# Patient Record
Sex: Female | Born: 1960 | Race: Black or African American | Hispanic: No | Smoking: Never smoker
Health system: Southern US, Community
[De-identification: ages and names within clinical notes are randomized; demographics above are authoritative.]

## PROBLEM LIST (undated history)

## (undated) DIAGNOSIS — E7800 Pure hypercholesterolemia, unspecified: Secondary | ICD-10-CM

## (undated) DIAGNOSIS — B019 Varicella without complication: Secondary | ICD-10-CM

## (undated) DIAGNOSIS — J309 Allergic rhinitis, unspecified: Secondary | ICD-10-CM

## (undated) HISTORY — DX: Allergic rhinitis, unspecified: J30.9

## (undated) HISTORY — PX: TUBAL LIGATION: SHX77

## (undated) HISTORY — DX: Varicella without complication: B01.9

## (undated) HISTORY — DX: Pure hypercholesterolemia, unspecified: E78.00

---

## 2009-01-23 ENCOUNTER — Other Ambulatory Visit: Admission: RE | Admit: 2009-01-23 | Discharge: 2009-01-23 | Payer: Self-pay | Admitting: Family Medicine

## 2009-01-23 ENCOUNTER — Ambulatory Visit: Payer: Self-pay | Admitting: Family Medicine

## 2009-02-14 ENCOUNTER — Encounter: Admission: RE | Admit: 2009-02-14 | Discharge: 2009-02-14 | Payer: Self-pay | Admitting: Family Medicine

## 2010-09-21 ENCOUNTER — Ambulatory Visit: Payer: Self-pay | Admitting: Family Medicine

## 2010-09-22 ENCOUNTER — Encounter: Admission: RE | Admit: 2010-09-22 | Discharge: 2010-09-22 | Payer: Self-pay | Admitting: Family Medicine

## 2010-09-29 ENCOUNTER — Encounter: Admission: RE | Admit: 2010-09-29 | Discharge: 2010-09-29 | Payer: Self-pay | Admitting: Family Medicine

## 2010-10-12 LAB — HM PAP SMEAR: HM Pap smear: NORMAL

## 2010-10-12 LAB — HM MAMMOGRAPHY: HM Mammogram: NEGATIVE

## 2010-12-18 ENCOUNTER — Other Ambulatory Visit: Payer: Self-pay | Admitting: Family Medicine

## 2010-12-18 ENCOUNTER — Other Ambulatory Visit: Payer: Self-pay | Admitting: Physician Assistant

## 2010-12-18 DIAGNOSIS — Z1231 Encounter for screening mammogram for malignant neoplasm of breast: Secondary | ICD-10-CM

## 2011-01-06 ENCOUNTER — Ambulatory Visit
Admission: RE | Admit: 2011-01-06 | Discharge: 2011-01-06 | Disposition: A | Discharge status: Home / Self Care | Payer: BC Managed Care – PPO | Source: Ambulatory Visit | Attending: Family Medicine | Admitting: Family Medicine

## 2011-01-06 DIAGNOSIS — Z1231 Encounter for screening mammogram for malignant neoplasm of breast: Secondary | ICD-10-CM

## 2011-01-11 ENCOUNTER — Encounter (INDEPENDENT_AMBULATORY_CARE_PROVIDER_SITE_OTHER): Payer: BC Managed Care – PPO | Admitting: Family Medicine

## 2011-01-11 DIAGNOSIS — Z0000 Encounter for general adult medical examination without abnormal findings: Secondary | ICD-10-CM

## 2011-01-11 DIAGNOSIS — E7800 Pure hypercholesterolemia, unspecified: Secondary | ICD-10-CM

## 2011-04-27 ENCOUNTER — Encounter: Payer: Self-pay | Admitting: Family Medicine

## 2011-05-17 ENCOUNTER — Encounter: Payer: Self-pay | Admitting: Family Medicine

## 2011-05-19 ENCOUNTER — Ambulatory Visit: Payer: BC Managed Care – PPO | Admitting: Family Medicine

## 2011-05-19 ENCOUNTER — Ambulatory Visit (INDEPENDENT_AMBULATORY_CARE_PROVIDER_SITE_OTHER): Payer: BC Managed Care – PPO | Admitting: Family Medicine

## 2011-05-19 DIAGNOSIS — E7800 Pure hypercholesterolemia, unspecified: Secondary | ICD-10-CM

## 2011-05-20 ENCOUNTER — Telehealth: Payer: Self-pay | Admitting: *Deleted

## 2011-05-20 LAB — LIPID PANEL
LDL Cholesterol: 151 mg/dL — ABNORMAL HIGH (ref 0–99)
Total CHOL/HDL Ratio: 4.3 Ratio
VLDL: 15 mg/dL (ref 0–40)

## 2011-05-20 NOTE — Telephone Encounter (Signed)
Spoke with patient, gave her labs results. She will schedule appt for lipid panel in 6 months and an OV a few days after, she could not commit to an appt today but assured me that she will call back in Sept to do so. She will bring her BP machine and some readings to that OV in January, she will come in sooner if BP's should rise above 140/90.

## 2012-01-03 ENCOUNTER — Other Ambulatory Visit: Payer: Self-pay | Admitting: Family Medicine

## 2012-01-03 DIAGNOSIS — Z1231 Encounter for screening mammogram for malignant neoplasm of breast: Secondary | ICD-10-CM

## 2012-01-05 ENCOUNTER — Other Ambulatory Visit: Payer: Self-pay | Admitting: *Deleted

## 2012-01-05 DIAGNOSIS — E7800 Pure hypercholesterolemia, unspecified: Secondary | ICD-10-CM

## 2012-01-10 ENCOUNTER — Other Ambulatory Visit: Payer: BC Managed Care – PPO

## 2012-01-10 DIAGNOSIS — E7800 Pure hypercholesterolemia, unspecified: Secondary | ICD-10-CM

## 2012-01-10 LAB — LIPID PANEL
HDL: 57 mg/dL (ref >39)
Triglycerides: 84 mg/dL (ref <150)

## 2012-01-12 ENCOUNTER — Encounter: Payer: Self-pay | Admitting: Family Medicine

## 2012-01-12 ENCOUNTER — Ambulatory Visit (INDEPENDENT_AMBULATORY_CARE_PROVIDER_SITE_OTHER): Payer: BC Managed Care – PPO | Admitting: Family Medicine

## 2012-01-12 DIAGNOSIS — IMO0001 Reserved for inherently not codable concepts without codable children: Secondary | ICD-10-CM | POA: Insufficient documentation

## 2012-01-12 DIAGNOSIS — R030 Elevated blood-pressure reading, without diagnosis of hypertension: Secondary | ICD-10-CM

## 2012-01-12 DIAGNOSIS — E7800 Pure hypercholesterolemia, unspecified: Secondary | ICD-10-CM

## 2012-01-12 NOTE — Patient Instructions (Signed)
Continue to periodically monitor BP.  Low sodium diet.  Try and get at least 30 minutes of exercise every day (5-6 days/week).  Continue low cholesterol diet.  2 Gram Low Sodium Diet A 2 gram sodium diet restricts the amount of sodium in the diet to no more than 2 g or 2000 mg daily. Limiting the amount of sodium is often used to help lower blood pressure. It is important if you have heart, liver, or kidney problems. Many foods contain sodium for flavor and sometimes as a preservative. When the amount of sodium in a diet needs to be low, it is important to know what to look for when choosing foods and drinks. The following includes some information and guidelines to help make it easier for you to adapt to a low sodium diet. QUICK TIPS  Do not add salt to food.   Avoid convenience items and fast food.   Choose unsalted snack foods.   Buy lower sodium products, often labeled as "lower sodium" or "no salt added."   Check food labels to learn how much sodium is in 1 serving.   When eating at a restaurant, ask that your food be prepared with less salt or none, if possible.  READING FOOD LABELS FOR SODIUM INFORMATION The nutrition facts label is a good place to find how much sodium is in foods. Look for products with no more than 500 to 600 mg of sodium per meal and no more than 150 mg per serving. Remember that 2 g = 2000 mg. The food label may also list foods as:  Sodium-free: Less than 5 mg in a serving.   Very low sodium: 35 mg or less in a serving.   Low-sodium: 140 mg or less in a serving.   Light in sodium: 50% less sodium in a serving. For example, if a food that usually has 300 mg of sodium is changed to become light in sodium, it will have 150 mg of sodium.   Reduced sodium: 25% less sodium in a serving. For example, if a food that usually has 400 mg of sodium is changed to reduced sodium, it will have 300 mg of sodium.  CHOOSING FOODS Grains  Avoid: Salted crackers and snack  items. Some cereals, including instant hot cereals. Bread stuffing and biscuit mixes. Seasoned rice or pasta mixes.   Choose: Unsalted snack items. Low-sodium cereals, oats, puffed wheat and rice, shredded wheat. English muffins and bread. Pasta.  Meats  Avoid: Salted, canned, smoked, spiced, pickled meats, including fish and poultry. Bacon, ham, sausage, cold cuts, hot dogs, anchovies.   Choose: Low-sodium canned tuna and salmon. Fresh or frozen meat, poultry, and fish.  Dairy  Avoid: Processed cheese and spreads. Cottage cheese. Buttermilk and condensed milk. Regular cheese.   Choose: Milk. Low-sodium cottage cheese. Yogurt. Sour cream. Low-sodium cheese.  Fruits and Vegetables  Avoid: Regular canned vegetables. Regular canned tomato sauce and paste. Frozen vegetables in sauces. Olives. Rosita Fire. Relishes. Sauerkraut.   Choose: Low-sodium canned vegetables. Low-sodium tomato sauce and paste. Frozen or fresh vegetables. Fresh and frozen fruit.  Condiments  Avoid: Canned and packaged gravies. Worcestershire sauce. Tartar sauce. Barbecue sauce. Soy sauce. Steak sauce. Ketchup. Onion, garlic, and table salt. Meat flavorings and tenderizers.   Choose: Fresh and dried herbs and spices. Low-sodium varieties of mustard and ketchup. Lemon juice. Tabasco sauce. Horseradish.  SAMPLE 2 GRAM SODIUM MEAL PLAN Breakfast / Sodium (mg)  1 cup low-fat milk / 143 mg   2 slices  whole-wheat toast / 270 mg   1 tbs heart-healthy margarine / 153 mg   1 hard-boiled egg / 139 mg   1 small orange / 0 mg  Lunch / Sodium (mg)  1 cup raw carrots / 76 mg    cup hummus / 298 mg   1 cup low-fat milk / 143 mg    cup red grapes / 2 mg   1 whole-wheat pita bread / 356 mg  Dinner / Sodium (mg)  1 cup whole-wheat pasta / 2 mg   1 cup low-sodium tomato sauce / 73 mg   3 oz lean ground beef / 57 mg   1 small side salad (1 cup raw spinach leaves,  cup cucumber,  cup yellow bell pepper) with 1 tsp  olive oil and 1 tsp red wine vinegar / 25 mg  Snack / Sodium (mg)  1 container low-fat vanilla yogurt / 107 mg   3 graham cracker squares / 127 mg  Nutrient Analysis  Calories: 2033   Protein: 77 g   Carbohydrate: 282 g   Fat: 72 g   Sodium: 1971 mg  Document Released: 10/25/2005 Document Revised: 10/14/2011 Document Reviewed: 01/26/2010 [facility] Patient Information 2012 Esp[place], Mildred.

## 2012-01-12 NOTE — Progress Notes (Signed)
Patient presents for f/u lipids. She was last seen a year ago for a physical, at which time she was having some elevated blood pressures, and was found to have high cholesterol.  Cholesterol was a little improved on repeat testing, and just had labs drawn for 6 month f/u.  She presents to f/u on those lipids. She has been eating healthier, eating more fruits, salads and greens.  Infrequent red meat, uses vinaigrette dressings.  Exercising 2-3 times/week on treadmill, and weights twice a week  BP's 128/80 while fast through [employer].  Was a little higher a few days ago (146/84), admits to having some canned foods.  Past Medical History  Diagnosis Date  . Allergic rhinitis, cause unspecified   . Pure hypercholesterolemia     Past Surgical History  Procedure Date  . Tubal ligation     History   Social History  . Marital Status: Widowed    Spouse Name: N/A    Number of Children: 3  . Years of Education: N/A   Occupational History  . Not on file.   Social History Main Topics  . Smoking status: Never Smoker   . Smokeless tobacco: Never Used  . Alcohol Use: No  . Drug Use: No  . Sexually Active:    Other Topics Concern  . Not on file   Social History Narrative   Lives with 2 sons, oldest son also in [place].  Widowed 07/2011.  From [facility]    Family History  Problem Relation Age of Onset  . Diabetes Mother   . Hyperlipidemia Mother   . Cancer Father     lymphoma    Current outpatient prescriptions:loratadine (CLARITIN) 10 MG tablet, Take 10 mg by mouth daily., Disp: , Rfl:   No Known Allergies  ROS:  Denies fevers.  Has some allergies, occasional chest congestion.  Denies exertional chest pain, palpitations, headaches, dizziness, edema, joint pains, skin rashes, depression (coping well after loss of her husband in September)  PHYSICAL EXAM: BP 102/68  Pulse 72  Ht 5\' 5"  (1.651 m)  Wt 144 lb (65.318 kg)  BMI 23.96 kg/m2  LMP 01/09/2012 134/82 Well developed, pleasant  female in no distress HEENT:  PERRL, EOMI, sclera anicteric, OP clear Neck: no lymphadenopathy, thyromegaly or mass Heart: regular rate and rhythm without murmur Lungs: clear bilaterally Abdomen: soft, nontender Extremities: no edema, 2+ pulse Skin: no rash Psych: normal mood, affect  Lab Results  Component Value Date   CHOL 222* 01/10/2012   CHOL 216* 05/19/2011   Lab Results  Component Value Date   HDL 57 01/10/2012   HDL 50 05/19/2011   Lab Results  Component Value Date   LDLCALC 148* 01/10/2012   LDLCALC 151* 05/19/2011   Lab Results  Component Value Date   TRIG 84 01/10/2012   TRIG 75 05/19/2011   Lab Results  Component Value Date   CHOLHDL 3.9 01/10/2012   CHOLHDL 4.3 05/19/2011   No results found for this basename: LDLDIRECT   ASSESSMENT/PLAN: 1. Pure hypercholesterolemia   2. Elevated BP    As long as her BP's remain normal, then her lipids are okay.  Ideally, LDL<130 is goal  But if not hypertensive, then <160 with normal ratio is okay.  Encouraged daily exercise, low sodium diet, and reviewed low cholesterol diet.  F/u at CPE/pap next available (she prefers to get GYN exams done as part of physical here rather than with GYN)  25 minute visit, more than 1/2 spent counseling

## 2012-01-28 ENCOUNTER — Ambulatory Visit
Admission: RE | Admit: 2012-01-28 | Discharge: 2012-01-28 | Disposition: A | Discharge status: Home / Self Care | Payer: BC Managed Care – PPO | Source: Ambulatory Visit | Attending: Family Medicine | Admitting: Family Medicine

## 2012-01-28 DIAGNOSIS — Z1231 Encounter for screening mammogram for malignant neoplasm of breast: Secondary | ICD-10-CM

## 2012-01-31 LAB — HM MAMMOGRAPHY: HM Mammogram: NEGATIVE

## 2012-03-20 ENCOUNTER — Encounter: Payer: Self-pay | Admitting: Family Medicine

## 2012-03-20 ENCOUNTER — Encounter: Payer: Self-pay | Admitting: Internal Medicine

## 2012-03-20 ENCOUNTER — Ambulatory Visit (INDEPENDENT_AMBULATORY_CARE_PROVIDER_SITE_OTHER): Payer: BC Managed Care – PPO | Admitting: Family Medicine

## 2012-03-20 VITALS — BP 126/82 | HR 64 | Ht 65.0 in | Wt 145.0 lb

## 2012-03-20 DIAGNOSIS — R5381 Other malaise: Secondary | ICD-10-CM

## 2012-03-20 DIAGNOSIS — Z1211 Encounter for screening for malignant neoplasm of colon: Secondary | ICD-10-CM

## 2012-03-20 DIAGNOSIS — Z0000 Encounter for general adult medical examination without abnormal findings: Secondary | ICD-10-CM

## 2012-03-20 LAB — CBC WITH DIFFERENTIAL/PLATELET
Basophils Absolute: 0 10*3/uL (ref 0.0–0.1)
Basophils Relative: 0 % (ref 0–1)
HCT: 37.1 % (ref 36.0–46.0)
Lymphocytes Relative: 26 % (ref 12–46)
MCHC: 31 g/dL (ref 30.0–36.0)
Monocytes Absolute: 0.3 10*3/uL (ref 0.1–1.0)
Neutro Abs: 4.4 10*3/uL (ref 1.7–7.7)
Neutrophils Relative %: 67 % (ref 43–77)
Platelets: 365 10*3/uL (ref 150–400)
RDW: 15.3 % (ref 11.5–15.5)
WBC: 6.6 10*3/uL (ref 4.0–10.5)

## 2012-03-20 LAB — COMPREHENSIVE METABOLIC PANEL
ALT: 11 U/L (ref 0–35)
AST: 17 U/L (ref 0–37)
Albumin: 4.7 g/dL (ref 3.5–5.2)
CO2: 26 mEq/L (ref 19–32)
Calcium: 9.3 mg/dL (ref 8.4–10.5)
Chloride: 105 mEq/L (ref 96–112)
Creat: 0.64 mg/dL (ref 0.50–1.10)
Potassium: 4.5 mEq/L (ref 3.5–5.3)

## 2012-03-20 LAB — POCT URINALYSIS DIPSTICK
Bilirubin, UA: NEGATIVE
Blood, UA: NEGATIVE
Glucose, UA: NEGATIVE
Ketones, UA: NEGATIVE
Leukocytes, UA: NEGATIVE
pH, UA: 8

## 2012-03-20 LAB — TSH: TSH: 1.133 u[IU]/mL (ref 0.350–4.500)

## 2012-03-20 NOTE — Patient Instructions (Addendum)
HEALTH MAINTENANCE RECOMMENDATIONS:  It is recommended that you get at least 30 minutes of aerobic exercise at least 5 days/week (for weight loss, you may need as much as 60-90 minutes). This can be any activity that gets your heart rate up. This can be divided in 10-15 minute intervals if needed, but try and build up your endurance at least once a week.  Weight bearing exercise is also recommended twice weekly.  Eat a healthy diet with lots of vegetables, fruits and fiber.  "Colorful" foods have a lot of vitamins (ie green vegetables, tomatoes, red peppers, etc).  Limit sweet tea, regular sodas and alcoholic beverages, all of which has a lot of calories and sugar.  Up to 1 alcoholic drink daily may be beneficial for women (unless trying to lose weight, watch sugars).  Drink a lot of water.  Calcium recommendations are 1200-1500 mg daily (1500 mg for postmenopausal women or women without ovaries), and vitamin D 1000 IU daily.  This should be obtained from diet and/or supplements (vitamins), and calcium should not be taken all at once, but in divided doses.  Monthly self breast exams and yearly mammograms for women over the age of 47 is recommended.  Sunscreen of at least SPF 30 should be used on all sun-exposed parts of the skin when outside between the hours of 10 am and 4 pm (not just when at beach or pool, but even with exercise, golf, tennis, and yard work!)  Use a sunscreen that says "broad spectrum" so it covers both UVA and UVB rays, and make sure to reapply every 1-2 hours.  Remember to change the batteries in your smoke detectors when changing your clock times in the spring and fall.  Use your seat belt every time you are in a car, and please drive safely and not be distracted with cell phones and texting while driving.  Consider shingles vaccine--check with your insurance to see if they cover it. It may need to wait until you are 60, if that is when they cover it.

## 2012-03-20 NOTE — Progress Notes (Signed)
Stephanie Wu is a 51 y.o. female who presents for a complete physical.  She has the following concerns: None  Periodically checks BP's, running 129/82 range. She was recently seen (<2 months ago) review her BP's and lipid results.  She continues to follow low cholesterol diet.  Health maintenance: Immunization History  Administered Date(s) Administered  . Tdap 04/27/2007   Last Pap smear: 10/2010 at GYN , normal per patient.  Denies any abnormal paps.  Last mammogram: 01/31/12 Last colonoscopy: never Last DEXA: never Dentist: twice yearly Ophtho: 2 years ago Exercise: 2-3 times/week on treadmill, weights twice a week  Past Medical History  Diagnosis Date  . Allergic rhinitis, cause unspecified   . Pure hypercholesterolemia   . Chicken pox age 48    Past Surgical History  Procedure Date  . Tubal ligation     History   Social History  . Marital Status: Widowed    Spouse Name: N/A    Number of Children: 3  . Years of Education: N/A   Occupational History  . Not on file.   Social History Main Topics  . Smoking status: Never Smoker   . Smokeless tobacco: Never Used  . Alcohol Use: No  . Drug Use: No  . Sexually Active:    Other Topics Concern  . Not on file   Social History Narrative   Lives with 2 sons, oldest son also in [place].  Widowed 07/2011.  From [facility]    Family History  Problem Relation Age of Onset  . Diabetes Mother   . Hyperlipidemia Mother   . Cancer Father     lymphoma    Current outpatient prescriptions:loratadine (CLARITIN) 10 MG tablet, Take 10 mg by mouth daily., Disp: , Rfl:   No Known Allergies  ROS:  The patient denies anorexia, fever, weight changes, headaches,  vision changes, decreased hearing, ear pain, sore throat, breast concerns, chest pain, palpitations, dizziness, syncope, dyspnea on exertion, cough, swelling, nausea, vomiting, diarrhea, constipation, abdominal pain, melena, hematochezia, indigestion/heartburn, hematuria,  incontinence, dysuria, irregular menstrual cycles, vaginal discharge, odor or itch, genital lesions, joint pains, numbness, tingling, weakness, tremor, suspicious skin lesions, depression, anxiety, abnormal bleeding/bruising, or enlarged lymph nodes.  Occasionally feels tired, as she is on the go a lot.  PHYSICAL EXAM: BP 126/82  Pulse 64  Ht 5\' 5"  (1.651 m)  Wt 145 lb (65.772 kg)  BMI 24.13 kg/m2  LMP 02/12/2012  General Appearance:    Alert, cooperative, no distress, appears stated age  Head:    Normocephalic, without obvious abnormality, atraumatic  Eyes:    PERRL, conjunctiva/corneas clear, EOM's intact, fundi    benign  Ears:    Normal TM's and external ear canals  Nose:   Nares normal, mucosa normal, no drainage or sinus   tenderness  Throat:   Lips, mucosa, and tongue normal; teeth and gums normal. Cobblestoning posteriorly  Neck:   Supple, no lymphadenopathy;  thyroid:  no   enlargement/tenderness/nodules; no carotid   bruit or JVD  Back:    Spine nontender, no curvature, ROM normal, no CVA     tenderness  Lungs:     Clear to auscultation bilaterally without wheezes, rales or     ronchi; respirations unlabored  Chest Wall:    No tenderness or deformity   Heart:    Regular rate and rhythm, S1 and S2 normal, no murmur, rub   or gallop  Breast Exam:    No tenderness, masses, or nipple discharge or inversion.  No axillary lymphadenopathy  Abdomen:     Soft, non-tender, nondistended, normoactive bowel sounds,    no masses, no hepatosplenomegaly  Genitalia:    Normal external genitalia without lesions.  BUS and vagina normal; no cervical motion tenderness. Uterus and adnexa not enlarged, nontender, no masses.  Pap not performed  Rectal:    Normal tone, no masses or tenderness; guaiac negative stool  Extremities:   No clubbing, cyanosis or edema  Pulses:   2+ and symmetric all extremities  Skin:   Skin color, texture, turgor normal, no rashes or lesions. slightly irritated mole in  center of upper back, somewhat rough.  Normal color.  No change in size per pt.  Lymph nodes:   Cervical, supraclavicular, and axillary nodes normal  Neurologic:   CNII-XII intact, normal strength, sensation and gait; reflexes 2+ and symmetric throughout          Psych:   Normal mood, affect, hygiene and grooming.     ASSESSMENT/PLAN: 1. Routine general medical examination at a health care facility  POCT Urinalysis Dipstick, Visual acuity screening, Comprehensive metabolic panel, CBC with Differential, Vitamin D 25 hydroxy, TSH  2. Other malaise and fatigue  Comprehensive metabolic panel, CBC with Differential, Vitamin D 25 hydroxy, TSH  3. Colon cancer screening  Ambulatory referral to Gastroenterology   Discussed monthly self breast exams and yearly mammograms after the age of 64; at least 30 minutes of aerobic activity at least 5 days/week; proper sunscreen use reviewed; healthy diet, including goals of calcium and vitamin D intake and alcohol recommendations (less than or equal to 1 drink/day) reviewed; regular seatbelt use; changing batteries in smoke detectors.  Immunization recommendations discussed--consider shingles vaccine if covered, otherwise at age 37 (if covered).  Colonoscopy recommendations reviewed--due now.  Referred to GI  F/u 1 year to recheck lipids, sooner if BP's are running high.

## 2012-03-21 ENCOUNTER — Encounter: Payer: Self-pay | Admitting: Family Medicine

## 2012-03-21 DIAGNOSIS — E559 Vitamin D deficiency, unspecified: Secondary | ICD-10-CM | POA: Insufficient documentation

## 2012-03-21 LAB — VITAMIN D 25 HYDROXY (VIT D DEFICIENCY, FRACTURES): Vit D, 25-Hydroxy: 17 ng/mL — ABNORMAL LOW (ref 30–89)

## 2012-03-23 ENCOUNTER — Other Ambulatory Visit: Payer: Self-pay | Admitting: *Deleted

## 2012-03-23 MED ORDER — ERGOCALCIFEROL 1.25 MG (50000 UT) PO CAPS: 50000.0000 [IU] | ORAL_CAPSULE | ORAL | Status: DC

## 2012-04-08 HISTORY — PX: COLONOSCOPY: SHX174

## 2012-04-14 ENCOUNTER — Ambulatory Visit (AMBULATORY_SURGERY_CENTER): Payer: BC Managed Care – PPO | Admitting: *Deleted

## 2012-04-14 VITALS — Ht 65.0 in | Wt 145.0 lb

## 2012-04-14 DIAGNOSIS — Z1211 Encounter for screening for malignant neoplasm of colon: Secondary | ICD-10-CM

## 2012-04-14 MED ORDER — PEG-KCL-NACL-NASULF-NA ASC-C 100 G PO SOLR: ORAL | Status: DC

## 2012-04-17 ENCOUNTER — Encounter: Payer: Self-pay | Admitting: Internal Medicine

## 2012-04-28 ENCOUNTER — Encounter: Payer: Self-pay | Admitting: Internal Medicine

## 2012-04-28 ENCOUNTER — Ambulatory Visit (AMBULATORY_SURGERY_CENTER): Payer: BC Managed Care – PPO | Admitting: Internal Medicine

## 2012-04-28 VITALS — BP 150/87 | HR 77 | Temp 95.9°F | Resp 18 | Ht 65.0 in | Wt 145.0 lb

## 2012-04-28 DIAGNOSIS — Z1211 Encounter for screening for malignant neoplasm of colon: Secondary | ICD-10-CM

## 2012-04-28 MED ORDER — SODIUM CHLORIDE 0.9 % IV SOLN: 500.0000 mL | INTRAVENOUS | Status: AC

## 2012-04-28 NOTE — Patient Instructions (Signed)
YOU HAD AN ENDOSCOPIC PROCEDURE TODAY AT THE [facility]: Refer to the procedure report that was given to you for any specific questions about what was found during the examination.  If the procedure report does not answer your questions, please call your gastroenterologist to clarify.  If you requested that your care partner not be given the details of your procedure findings, then the procedure report has been included in a sealed envelope for you to review at your convenience later.  YOU SHOULD EXPECT: Some feelings of bloating in the abdomen. Passage of more gas than usual.  Walking can help get rid of the air that was put into your GI tract during the procedure and reduce the bloating. If you had a lower endoscopy (such as a colonoscopy or flexible sigmoidoscopy) you may notice spotting of blood in your stool or on the toilet paper. If you underwent a bowel prep for your procedure, then you may not have a normal bowel movement for a few days.  DIET: Your first meal following the procedure should be a light meal and then it is ok to progress to your normal diet.  A half-sandwich or bowl of soup is an example of a good first meal.  Heavy or fried foods are harder to digest and may make you feel nauseous or bloated.  Likewise meals heavy in dairy and vegetables can cause extra gas to form and this can also increase the bloating.  Drink plenty of fluids but you should avoid alcoholic beverages for 24 hours.  ACTIVITY: Your care partner should take you home directly after the procedure.  You should plan to take it easy, moving slowly for the rest of the day.  You can resume normal activity the day after the procedure however you should NOT DRIVE or use heavy machinery for 24 hours (because of the sedation medicines used during the test).    SYMPTOMS TO REPORT IMMEDIATELY: A gastroenterologist can be reached at any hour.  During normal business hours, 8:30 AM to 5:00 PM Monday through Friday,  call [phone].  After hours and on weekends, please call the GI answering service at [phone] who will take a message and have the physician on call contact you.   Following lower endoscopy (colonoscopy or flexible sigmoidoscopy):  Excessive amounts of blood in the stool  Significant tenderness or worsening of abdominal pains  Swelling of the abdomen that is new, acute  Fever of 100F or higher  Following upper endoscopy (EGD)  Vomiting of blood or coffee ground material  New chest pain or pain under the shoulder blades  Painful or persistently difficult swallowing  New shortness of breath  Fever of 100F or higher  Black, tarry-looking stools  FOLLOW UP: If any biopsies were taken you will be contacted by phone or by letter within the next 1-3 weeks.  Call your gastroenterologist if you have not heard about the biopsies in 3 weeks.  Our staff will call the home number listed on your records the next business day following your procedure to check on you and address any questions or concerns that you may have at that time regarding the information given to you following your procedure. This is a courtesy call and so if there is no answer at the home number and we have not heard from you through the emergency physician on call, we will assume that you have returned to your regular daily activities without incident.  SIGNATURES/CONFIDENTIALITY: You and/or your care   partner have signed paperwork which will be entered into your electronic medical record.  These signatures attest to the fact that that the information above on your After Visit Summary has been reviewed and is understood.  Full responsibility of the confidentiality of this discharge information lies with you and/or your care-partner.  

## 2012-04-28 NOTE — Op Note (Signed)
[facility] [address] [place], [place]  [address]  COLONOSCOPY PROCEDURE REPORT  PATIENT:  Stephanie Wu, Stephanie Wu  MR#:  [id number] BIRTHDATE:  Mar 31, 1961, 51 yrs. old  GENDER:  female ENDOSCOPIST:  Hedwig Morton. Juanda Chance, MD REF. BY:  Joselyn Arrow, M.D. PROCEDURE DATE:  04/28/2012 PROCEDURE:  Colonoscopy 78469 ASA CLASS:  Class I INDICATIONS:  colorectal cancer screening, average risk MEDICATIONS:   MAC sedation, administered by CRNA, propofol (Diprivan) 250 mg  DESCRIPTION OF PROCEDURE:   After the risks and benefits and of the procedure were explained, informed consent was obtained. Digital rectal exam was performed and revealed no rectal masses. The LB [id number]-[id number] endoscope was introduced through the anus and advanced to the cecum, which was identified by both the appendix and ileocecal valve.  The quality of the prep was excellent, using MoviPrep.  The instrument was then slowly withdrawn as the colon was fully examined. <<PROCEDUREIMAGES>>  FINDINGS:  No polyps or cancers were seen (see image1, image2, image3, and image4).   Retroflexed views in the rectum revealed no abnormalities.    The scope was then withdrawn from the patient and the procedure completed.  COMPLICATIONS:  None ENDOSCOPIC IMPRESSION: 1) No polyps or cancers 2) Normal colonoscopy RECOMMENDATIONS: 1) High fiber diet.  REPEAT EXAM:  In 10 year(s) for.  ______________________________ Hedwig Morton. Juanda Chance, MD  CC:  n. eSIGNED:   Hedwig Morton. Ulric Salzman at 04/28/2012 08:52 AM  Kristeen Miss, [phone]

## 2012-04-28 NOTE — Progress Notes (Signed)
Patient did not experience any of the following events: a burn prior to discharge; a fall within the facility; wrong site/side/patient/procedure/implant event; or a hospital transfer or hospital admission upon discharge from the facility. (G8907) Patient did not have preoperative order for IV antibiotic SSI prophylaxis. (G8918)  

## 2012-05-01 ENCOUNTER — Telehealth: Payer: Self-pay | Admitting: *Deleted

## 2012-05-01 NOTE — Telephone Encounter (Signed)
  Follow up Call-  Call back number 04/28/2012  Post procedure Call Back phone  # [phone]  Permission to leave phone message Yes     Patient questions:  Do you have a fever, pain , or abdominal swelling? no Pain Score  0 *  Have you tolerated food without any problems? yes  Have you been able to return to your normal activities? yes  Do you have any questions about your discharge instructions: Diet   no Medications  no Follow up visit  no  Do you have questions or concerns about your Care? no  Actions: * If pain score is 4 or above: No action needed, pain <4.

## 2012-05-04 ENCOUNTER — Encounter: Payer: Self-pay | Admitting: Family Medicine

## 2013-03-12 ENCOUNTER — Other Ambulatory Visit (INDEPENDENT_AMBULATORY_CARE_PROVIDER_SITE_OTHER): Payer: Managed Care, Other (non HMO)

## 2013-03-12 DIAGNOSIS — Z111 Encounter for screening for respiratory tuberculosis: Secondary | ICD-10-CM

## 2013-03-12 DIAGNOSIS — Z23 Encounter for immunization: Secondary | ICD-10-CM

## 2013-03-14 ENCOUNTER — Other Ambulatory Visit: Payer: Self-pay

## 2013-03-14 LAB — TB SKIN TEST: TB Skin Test: NEGATIVE

## 2013-04-11 NOTE — Progress Notes (Signed)
DT 

## 2013-08-02 ENCOUNTER — Encounter: Payer: Self-pay | Admitting: Family Medicine

## 2013-08-03 ENCOUNTER — Encounter: Payer: Self-pay | Admitting: Family Medicine

## 2013-08-03 ENCOUNTER — Other Ambulatory Visit (HOSPITAL_COMMUNITY)
Admission: RE | Admit: 2013-08-03 | Discharge: 2013-08-03 | Disposition: A | Discharge status: Home / Self Care | Payer: Managed Care, Other (non HMO) | Source: Ambulatory Visit | Attending: Family Medicine | Admitting: Family Medicine

## 2013-08-03 ENCOUNTER — Ambulatory Visit (INDEPENDENT_AMBULATORY_CARE_PROVIDER_SITE_OTHER): Payer: Managed Care, Other (non HMO) | Admitting: Family Medicine

## 2013-08-03 VITALS — BP 138/84 | HR 80 | Ht 65.25 in | Wt 146.0 lb

## 2013-08-03 DIAGNOSIS — IMO0001 Reserved for inherently not codable concepts without codable children: Secondary | ICD-10-CM

## 2013-08-03 DIAGNOSIS — Z01419 Encounter for gynecological examination (general) (routine) without abnormal findings: Secondary | ICD-10-CM | POA: Insufficient documentation

## 2013-08-03 DIAGNOSIS — E7800 Pure hypercholesterolemia, unspecified: Secondary | ICD-10-CM

## 2013-08-03 DIAGNOSIS — R030 Elevated blood-pressure reading, without diagnosis of hypertension: Secondary | ICD-10-CM

## 2013-08-03 DIAGNOSIS — E559 Vitamin D deficiency, unspecified: Secondary | ICD-10-CM

## 2013-08-03 DIAGNOSIS — Z1151 Encounter for screening for human papillomavirus (HPV): Secondary | ICD-10-CM | POA: Insufficient documentation

## 2013-08-03 DIAGNOSIS — D649 Anemia, unspecified: Secondary | ICD-10-CM

## 2013-08-03 DIAGNOSIS — Z23 Encounter for immunization: Secondary | ICD-10-CM

## 2013-08-03 DIAGNOSIS — Z0000 Encounter for general adult medical examination without abnormal findings: Secondary | ICD-10-CM

## 2013-08-03 LAB — COMPREHENSIVE METABOLIC PANEL
AST: 15 U/L (ref 0–37)
BUN: 8 mg/dL (ref 6–23)
CO2: 27 mEq/L (ref 19–32)
Calcium: 9.6 mg/dL (ref 8.4–10.5)
Chloride: 106 mEq/L (ref 96–112)
Creat: 0.79 mg/dL (ref 0.50–1.10)
Glucose, Bld: 83 mg/dL (ref 70–99)
Total Bilirubin: 1.3 mg/dL — ABNORMAL HIGH (ref 0.3–1.2)

## 2013-08-03 LAB — POCT URINALYSIS DIPSTICK
Bilirubin, UA: NEGATIVE
Glucose, UA: NEGATIVE
Ketones, UA: NEGATIVE
Nitrite, UA: NEGATIVE
Spec Grav, UA: <=1.005

## 2013-08-03 LAB — CBC WITH DIFFERENTIAL/PLATELET
Basophils Relative: 0 % (ref 0–1)
HCT: 37.8 % (ref 36.0–46.0)
Hemoglobin: 12.6 g/dL (ref 12.0–15.0)
Lymphocytes Relative: 20 % (ref 12–46)
MCHC: 33.3 g/dL (ref 30.0–36.0)
MCV: 85.5 fL (ref 78.0–100.0)
Monocytes Absolute: 0.4 10*3/uL (ref 0.1–1.0)
Monocytes Relative: 5 % (ref 3–12)
Neutro Abs: 5.2 10*3/uL (ref 1.7–7.7)
WBC: 7.1 10*3/uL (ref 4.0–10.5)

## 2013-08-03 LAB — LIPID PANEL
Cholesterol: 220 mg/dL — ABNORMAL HIGH (ref 0–200)
HDL: 60 mg/dL (ref >39)
Total CHOL/HDL Ratio: 3.7 Ratio
Triglycerides: 85 mg/dL (ref <150)
VLDL: 17 mg/dL (ref 0–40)

## 2013-08-03 LAB — HM PAP SMEAR: HM Pap smear: NEGATIVE

## 2013-08-03 NOTE — Progress Notes (Signed)
Chief Complaint  Patient presents with  . Annual Exam    fasting annual exam with pap, did start cycle this morning so unsure if she can still do pap. Did not do eye exam as patient forgot her glasses. No concerns.   Stephanie Wu is a 52 y.o. female who presents for a complete physical.  She has the following concerns:  Elevated BP's in past: She checks BP's elsewhere, and they have been running 127-145/80-91 in the last 2 weeks.  Periods have been a little irregular--between 28-50 days, usually lasting 5 days, but in August lasted almost 2 weeks.  Period started this morning, still light.  She is having mild hot flashes.  She only takes iron tablets 3x/week, upsets her stomach some  Vitamin D deficiency--only taking supplement twice daily, thinks it also might upset her stomach.  Immunization History  Administered Date(s) Administered  . Influenza,inj,Quad PF,36+ Mos 08/03/2013  . PPD Test 03/12/2013  . Tdap 04/27/2007   Last Pap smear: 10/2010 at GYN , normal per patient. Denies any abnormal paps.  Last mammogram: 01/31/12  Last colonoscopy: 04/28/12, due again in 2023 Last DEXA: never  Dentist: twice yearly  Ophtho: 3 years ago (wears glasses, but didn't bring them today; pt declined vision exam) Exercise: walks 1 mile/day 5 days/week plus aerobics twice a week; weights twice a week (uses handweights with aerobics)  Past Medical History  Diagnosis Date  . Allergic rhinitis, cause unspecified   . Pure hypercholesterolemia   . Chicken pox age 6    Past Surgical History  Procedure Laterality Date  . Tubal ligation    . Colonoscopy  04/2012    Dr. Juanda Chance.  Normal    History   Social History  . Marital Status: Widowed    Spouse Name: N/A    Number of Children: 3  . Years of Education: N/A   Occupational History  . Not on file.   Social History Main Topics  . Smoking status: Never Smoker   . Smokeless tobacco: Never Used  . Alcohol Use: No  . Drug Use: No  .  Sexual Activity: Not Currently   Other Topics Concern  . Not on file   Social History Narrative   Lives with 2 sons, oldest son lives in [place].  Widowed 07/2011.  From [facility]    Family History  Problem Relation Age of Onset  . Diabetes Mother   . Hyperlipidemia Mother   . Cancer Father     lymphoma  . Heart disease Neg Hx     Current outpatient prescriptions:cholecalciferol (VITAMIN D) 1000 UNITS tablet, Take 1,000 Units by mouth daily., Disp: , Rfl: ;  Iron 66 MG TABS, Take 1 tablet by mouth daily., Disp: , Rfl: ;  loratadine (CLARITIN) 10 MG tablet, Take 10 mg by mouth daily., Disp: , Rfl: ;  vitamin B-12 (CYANOCOBALAMIN) 1000 MCG tablet, Take 1,000 mcg by mouth daily., Disp: , Rfl:  Current facility-administered medications:0.9 %  sodium chloride infusion, 500 mL, Intravenous, Continuous, Hart Carwin, MD  No Known Allergies  ROS: The patient denies anorexia, fever, weight changes, headaches, vision changes, decreased hearing, ear pain, sore throat, breast concerns, chest pain, palpitations, dizziness, syncope, dyspnea on exertion, cough, swelling, nausea, vomiting, diarrhea, constipation, abdominal pain, melena, hematochezia, indigestion/heartburn, hematuria, incontinence, dysuria, vaginal discharge, odor or itch, genital lesions, joint pains, numbness, tingling, weakness, tremor, suspicious skin lesions, depression, anxiety, abnormal bleeding/bruising, or enlarged lymph nodes. Rare palpitations, usually with her hot flashes Irregular menses as  per HPI She doesn't feel as tired as she used to since walking more regularly.  PHYSICAL EXAM: BP 172/98  Pulse 80  Ht 5' 5.25" (1.657 m)  Wt 146 lb (66.225 kg)  BMI 24.12 kg/m2  LMP 08/03/2013 138/84 on repeat by MD, RA  General Appearance:  Alert, cooperative, no distress, appears stated age   Head:  Normocephalic, without obvious abnormality, atraumatic   Eyes:  PERRL, conjunctiva/corneas clear, EOM's intact, fundi  benign    Ears:  Normal TM's and external ear canals   Nose:  Nares normal, mucosa normal, no drainage or sinus tenderness   Throat:  Lips, mucosa, and tongue normal; teeth and gums normal.   Neck:  Supple, no lymphadenopathy; thyroid: no enlargement/tenderness/nodules; no carotid  bruit or JVD   Back:  Spine nontender, no curvature, ROM normal, no CVA tenderness   Lungs:  Clear to auscultation bilaterally without wheezes, rales or ronchi; respirations unlabored   Chest Wall:  No tenderness or deformity   Heart:  Regular rate and rhythm, S1 and S2 normal, no murmur, rub  or gallop   Breast Exam:  No tenderness, masses, or nipple discharge or inversion. No axillary lymphadenopathy   Abdomen:  Soft, non-tender, nondistended, normoactive bowel sounds,  no masses, no hepatosplenomegaly   Genitalia:  Normal external genitalia without lesions. BUS and vagina normal; dark blood in vault.  Cervix without lesions; no cervical motion tenderness. Uterus and adnexa not enlarged, nontender, no masses. Pap performed   Rectal:  Normal tone, no masses or tenderness; guaiac negative stool   Extremities:  No clubbing, cyanosis or edema   Pulses:  2+ and symmetric all extremities   Skin:  Skin color, texture, turgor normal, no rashes or lesions.   Lymph nodes:  Cervical, supraclavicular, and axillary nodes normal   Neurologic:  CNII-XII intact, normal strength, sensation and gait; reflexes 2+ and symmetric throughout          Psych: Normal mood, affect, hygiene and grooming.    ASSESSMENT/PLAN:  Routine general medical examination at a health care facility - Plan: POCT Urinalysis Dipstick, Cytology - PAP [place], Lipid panel, Comprehensive metabolic panel, CBC with Differential  Need for prophylactic vaccination and inoculation against influenza - Plan: Flu Vaccine QUAD 36+ mos IM  Unspecified vitamin D deficiency - recheck today. May not be getting adequate OTC intake, only taking 2x/week - Plan: Vit D  25  hydroxy (rtn osteoporosis monitoring)  Pure hypercholesterolemia - reviewed low cholesterol diet.  her diet sounds very good - Plan: Lipid panel, Comprehensive metabolic panel  Elevated BP - okay overall at home.  continue to monitor; continue low sodium diet and regular exercise - Plan: Comprehensive metabolic panel  Anemia - Plan: CBC with Differential   Discussed monthly self breast exams and yearly mammograms after the age of 21; at least 30 minutes of aerobic activity at least 5 days/week; proper sunscreen use reviewed; healthy diet, including goals of calcium and vitamin D intake and alcohol recommendations (less than or equal to 1 drink/day) reviewed; regular seatbelt use; changing batteries in smoke detectors. Immunization recommendations discussed--consider shingles vaccine if covered, otherwise at age 70 (if covered). Colonoscopy recommendations reviewed--UTD, due in 2023

## 2013-08-03 NOTE — Patient Instructions (Addendum)
HEALTH MAINTENANCE RECOMMENDATIONS:  It is recommended that you get at least 30 minutes of aerobic exercise at least 5 days/week (for weight loss, you may need as much as 60-90 minutes). This can be any activity that gets your heart rate up. This can be divided in 10-15 minute intervals if needed, but try and build up your endurance at least once a week.  Weight bearing exercise is also recommended twice weekly.  Eat a healthy diet with lots of vegetables, fruits and fiber.  "Colorful" foods have a lot of vitamins (ie green vegetables, tomatoes, red peppers, etc).  Limit sweet tea, regular sodas and alcoholic beverages, all of which has a lot of calories and sugar.  Up to 1 alcoholic drink daily may be beneficial for women (unless trying to lose weight, watch sugars).  Drink a lot of water.  Calcium recommendations are 1200-1500 mg daily (1500 mg for postmenopausal women or women without ovaries), and vitamin D 1000 IU daily.  This should be obtained from diet and/or supplements (vitamins), and calcium should not be taken all at once, but in divided doses.  Monthly self breast exams and yearly mammograms for women over the age of 53 is recommended.  Sunscreen of at least SPF 30 should be used on all sun-exposed parts of the skin when outside between the hours of 10 am and 4 pm (not just when at beach or pool, but even with exercise, golf, tennis, and yard work!)  Use a sunscreen that says "broad spectrum" so it covers both UVA and UVB rays, and make sure to reapply every 1-2 hours.  Remember to change the batteries in your smoke detectors when changing your clock times in the spring and fall.  Use your seat belt every time you are in a car, and please drive safely and not be distracted with cell phones and texting while driving.  Please schedule your mammogram Feel free to check with your insurance to see if/when zostavax (shingles vaccine) is covered--likely not until age 36, but if covered after  50, return for a nurse visit (needs to a month after flu shot). Schedule routine eye exam

## 2013-08-05 ENCOUNTER — Encounter: Payer: Self-pay | Admitting: Family Medicine

## 2013-08-08 ENCOUNTER — Encounter: Payer: Self-pay | Admitting: Internal Medicine

## 2014-08-29 ENCOUNTER — Other Ambulatory Visit: Payer: Self-pay

## 2014-08-29 DIAGNOSIS — Z1231 Encounter for screening mammogram for malignant neoplasm of breast: Secondary | ICD-10-CM

## 2014-09-09 ENCOUNTER — Encounter: Payer: Self-pay | Admitting: Family Medicine

## 2014-09-17 ENCOUNTER — Ambulatory Visit
Admission: RE | Admit: 2014-09-17 | Discharge: 2014-09-17 | Disposition: A | Discharge status: Home / Self Care | Payer: No Typology Code available for payment source | Source: Ambulatory Visit

## 2014-09-17 DIAGNOSIS — Z1231 Encounter for screening mammogram for malignant neoplasm of breast: Secondary | ICD-10-CM

## 2015-09-08 ENCOUNTER — Ambulatory Visit (INDEPENDENT_AMBULATORY_CARE_PROVIDER_SITE_OTHER): Payer: No Typology Code available for payment source | Admitting: Family Medicine

## 2015-09-08 ENCOUNTER — Encounter: Payer: Self-pay | Admitting: Family Medicine

## 2015-09-08 VITALS — BP 154/84 | HR 83 | Ht 65.0 in | Wt 156.6 lb

## 2015-09-08 DIAGNOSIS — IMO0001 Reserved for inherently not codable concepts without codable children: Secondary | ICD-10-CM

## 2015-09-08 DIAGNOSIS — R635 Abnormal weight gain: Secondary | ICD-10-CM

## 2015-09-08 DIAGNOSIS — Z0000 Encounter for general adult medical examination without abnormal findings: Secondary | ICD-10-CM | POA: Diagnosis not present

## 2015-09-08 DIAGNOSIS — Z1159 Encounter for screening for other viral diseases: Secondary | ICD-10-CM | POA: Diagnosis not present

## 2015-09-08 DIAGNOSIS — E559 Vitamin D deficiency, unspecified: Secondary | ICD-10-CM

## 2015-09-08 DIAGNOSIS — R030 Elevated blood-pressure reading, without diagnosis of hypertension: Secondary | ICD-10-CM | POA: Diagnosis not present

## 2015-09-08 DIAGNOSIS — E7800 Pure hypercholesterolemia, unspecified: Secondary | ICD-10-CM

## 2015-09-08 LAB — COMPREHENSIVE METABOLIC PANEL
ALBUMIN: 4.5 g/dL (ref 3.6–5.1)
ALT: 17 U/L (ref 6–29)
AST: 19 U/L (ref 10–35)
Alkaline Phosphatase: 98 U/L (ref 33–130)
BUN: 10 mg/dL (ref 7–25)
CHLORIDE: 100 mmol/L (ref 98–110)
CO2: 28 mmol/L (ref 20–31)
CREATININE: 0.7 mg/dL (ref 0.50–1.05)
Calcium: 9.6 mg/dL (ref 8.6–10.4)
Glucose, Bld: 88 mg/dL (ref 65–99)
POTASSIUM: 4 mmol/L (ref 3.5–5.3)
Sodium: 139 mmol/L (ref 135–146)
Total Bilirubin: 1.2 mg/dL (ref 0.2–1.2)
Total Protein: 7.3 g/dL (ref 6.1–8.1)

## 2015-09-08 LAB — LIPID PANEL
CHOL/HDL RATIO: 4.2 ratio (ref <=5.0)
Cholesterol: 225 mg/dL — ABNORMAL HIGH (ref 125–200)
HDL: 54 mg/dL (ref >=46)
LDL CALC: 152 mg/dL — AB (ref <130)
Triglycerides: 96 mg/dL (ref <150)
VLDL: 19 mg/dL (ref <30)

## 2015-09-08 LAB — POCT URINALYSIS DIPSTICK
BILIRUBIN UA: NEGATIVE
Blood, UA: NEGATIVE
Glucose, UA: NEGATIVE
Ketones, UA: NEGATIVE
LEUKOCYTES UA: NEGATIVE
Nitrite, UA: NEGATIVE
Protein, UA: NEGATIVE
Spec Grav, UA: 1.015
Urobilinogen, UA: NEGATIVE
pH, UA: 6

## 2015-09-08 LAB — HEPATITIS C ANTIBODY: HCV AB: NEGATIVE

## 2015-09-08 LAB — TSH: TSH: 0.891 u[IU]/mL (ref 0.350–4.500)

## 2015-09-08 NOTE — Patient Instructions (Addendum)
HEALTH MAINTENANCE RECOMMENDATIONS:  It is recommended that you get at least 30 minutes of aerobic exercise at least 5 days/week (for weight loss, you may need as much as 60-90 minutes). This can be any activity that gets your heart rate up. This can be divided in 10-15 minute intervals if needed, but try and build up your endurance at least once a week.  Weight bearing exercise is also recommended twice weekly.  Eat a healthy diet with lots of vegetables, fruits and fiber.  "Colorful" foods have a lot of vitamins (ie green vegetables, tomatoes, red peppers, etc).  Limit sweet tea, regular sodas and alcoholic beverages, all of which has a lot of calories and sugar.  Up to 1 alcoholic drink daily may be beneficial for women (unless trying to lose weight, watch sugars).  Drink a lot of water.  Calcium recommendations are 1200-1500 mg daily (1500 mg for postmenopausal women or women without ovaries), and vitamin D 1000 IU daily.  This should be obtained from diet and/or supplements (vitamins), and calcium should not be taken all at once, but in divided doses.  Monthly self breast exams and yearly mammograms for women over the age of 46 is recommended.  Sunscreen of at least SPF 30 should be used on all sun-exposed parts of the skin when outside between the hours of 10 am and 4 pm (not just when at beach or pool, but even with exercise, golf, tennis, and yard work!)  Use a sunscreen that says "broad spectrum" so it covers both UVA and UVB rays, and make sure to reapply every 1-2 hours.  Remember to change the batteries in your smoke detectors when changing your clock times in the spring and fall.  Use your seat belt every time you are in a car, and please drive safely and not be distracted with cell phones and texting while driving.   Please reconsider getting flu shot this fall. When having heart palpitations--consider counting your pulse, paying attention to whether it is regular or irregular,  extra or skipped beats, etc. Return to discuss further if worsening, or any associated symptom such as dizziness, chest pressure, shortness of breath.   Start checking your blood pressures 2-3 times/week--vary the times, sometimes in the morning, sometimes in the evening.  Keep a chart with date/morning/evening/comments columns.  In the comments, include--stress, headache, tired, ate [place] or other salty food, anything that could contribute to your blood pressure (pain, dizziness). Bring your monitor and your list of blood pressures to your visit in 1 month. Try and get regular daily exercise and limit the sodium in your diet.  Low-Sodium Eating Plan Sodium raises blood pressure and causes water to be held in the body. Getting less sodium from food will help lower your blood pressure, reduce any swelling, and protect your heart, liver, and kidneys. We get sodium by adding salt (sodium chloride) to food. Most of our sodium comes from canned, boxed, and frozen foods. Restaurant foods, fast foods, and pizza are also very high in sodium. Even if you take medicine to lower your blood pressure or to reduce fluid in your body, getting less sodium from your food is important. WHAT IS MY PLAN? Most people should limit their sodium intake to 2,300 mg a day. Your health care provider recommends that you limit your sodium intake to __________ a day.  WHAT DO I NEED TO KNOW ABOUT THIS EATING PLAN? For the low-sodium eating plan, you will follow these general guidelines:  Choose foods  with a % Daily Value for sodium of less than 5% (as listed on the food label).   Use salt-free seasonings or herbs instead of table salt or sea salt.   Check with your health care provider or pharmacist before using salt substitutes.   Eat fresh foods.  Eat more vegetables and fruits.  Limit canned vegetables. If you do use them, rinse them well to decrease the sodium.   Limit cheese to 1 oz (28 g) per day.   Eat  lower-sodium products, often labeled as "lower sodium" or "no salt added."  Avoid foods that contain monosodium glutamate (MSG). MSG is sometimes added to [place] food and some canned foods.  Check food labels (Nutrition Facts labels) on foods to learn how much sodium is in one serving.  Eat more home-cooked food and less restaurant, buffet, and fast food.  When eating at a restaurant, ask that your food be prepared with less salt, or no salt if possible.  HOW DO I READ FOOD LABELS FOR SODIUM INFORMATION? The Nutrition Facts label lists the amount of sodium in one serving of the food. If you eat more than one serving, you must multiply the listed amount of sodium by the number of servings. Food labels may also identify foods as:  Sodium free--Less than 5 mg in a serving.  Very low sodium--35 mg or less in a serving.  Low sodium--140 mg or less in a serving.  Light in sodium--50% less sodium in a serving. For example, if a food that usually has 300 mg of sodium is changed to become light in sodium, it will have 150 mg of sodium.  Reduced sodium--25% less sodium in a serving. For example, if a food that usually has 400 mg of sodium is changed to reduced sodium, it will have 300 mg of sodium. WHAT FOODS CAN I EAT? Grains Low-sodium cereals, including oats, puffed wheat and rice, and shredded wheat cereals. Low-sodium crackers. Unsalted rice and pasta. Lower-sodium bread.  Vegetables Frozen or fresh vegetables. Low-sodium or reduced-sodium canned vegetables. Low-sodium or reduced-sodium tomato sauce and paste. Low-sodium or reduced-sodium tomato and vegetable juices.  Fruits Fresh, frozen, and canned fruit. Fruit juice.  Meat and Other Protein Products Low-sodium canned tuna and salmon. Fresh or frozen meat, poultry, seafood, and fish. Lamb. Unsalted nuts. Dried beans, peas, and lentils without added salt. Unsalted canned beans. Homemade soups without salt. Eggs.  Dairy Milk. Soy  milk. Ricotta cheese. Low-sodium or reduced-sodium cheeses. Yogurt.  Condiments Fresh and dried herbs and spices. Salt-free seasonings. Onion and garlic powders. Low-sodium varieties of mustard and ketchup. Fresh or refrigerated horseradish. Lemon juice.  Fats and Oils Reduced-sodium salad dressings. Unsalted butter.  Other Unsalted popcorn and pretzels.  The items listed above may not be a complete list of recommended foods or beverages. Contact your dietitian for more options. WHAT FOODS ARE NOT RECOMMENDED? Grains Instant hot cereals. Bread stuffing, pancake, and biscuit mixes. Croutons. Seasoned rice or pasta mixes. Noodle soup cups. Boxed or frozen macaroni and cheese. Self-rising flour. Regular salted crackers. Vegetables Regular canned vegetables. Regular canned tomato sauce and paste. Regular tomato and vegetable juices. Frozen vegetables in sauces. Salted [place] fries. Olives. Rosita Fire. Relishes. Sauerkraut. Salsa. Meat and Other Protein Products Salted, canned, smoked, spiced, or pickled meats, seafood, or fish. Bacon, ham, sausage, hot dogs, corned beef, chipped beef, and packaged luncheon meats. Salt pork. Jerky. Pickled herring. Anchovies, regular canned tuna, and sardines. Salted nuts. Dairy Processed cheese and cheese spreads. Cheese curds. Blue  cheese and cottage cheese. Buttermilk.  Condiments Onion and garlic salt, seasoned salt, table salt, and sea salt. Canned and packaged gravies. Worcestershire sauce. Tartar sauce. Barbecue sauce. Teriyaki sauce. Soy sauce, including reduced sodium. Steak sauce. Fish sauce. Oyster sauce. Cocktail sauce. Horseradish that you find on the shelf. Regular ketchup and mustard. Meat flavorings and tenderizers. Bouillon cubes. Hot sauce. Tabasco sauce. Marinades. Taco seasonings. Relishes. Fats and Oils Regular salad dressings. Salted butter. Margarine. Ghee. Bacon fat.  Other Potato and tortilla chips. Corn chips and puffs. Salted  popcorn and pretzels. Canned or dried soups. Pizza. Frozen entrees and pot pies.  The items listed above may not be a complete list of foods and beverages to avoid. Contact your dietitian for more information.   This information is not intended to replace advice given to you by your health care provider. Make sure you discuss any questions you have with your health care provider.   Document Released: 04/16/2002 Document Revised: 11/15/2014 Document Reviewed: 08/29/2013 Elsevier Interactive Patient Education [employer]2016 [employer]

## 2015-09-08 NOTE — Progress Notes (Signed)
Chief Complaint  Patient presents with  . Annual Exam    patient is here for cpe last pap:08/03/13,Mammo:09/17/14,colon:04/28/12,ekg:2010 denied flu shot and states B/P is always high    Stephanie Wu is a 54 y.o. female who presents for a complete physical.  She has no specific complaints.  Last visit was 2 years ago.  Elevated BP's in past: She has a monitor, had been checking it daily, started getting higher, thought it might be anxiety triggering it, so she took a break from checking.  Denies headaches, chest pain. +chinese food often. Otherwise tries to limit salt intake  Vitamin D deficiency--She is taking a powder MVI daily, but admits to taking the separate vitamin D only 3-4x/week.  Immunization History  Administered Date(s) Administered  . Influenza,inj,Quad PF,36+ Mos 08/03/2013  . PPD Test 03/12/2013  . Tdap 04/27/2007  refuses flu shot--got ill the last time she took one Last Pap smear: 07/2013.  Denies h/o abnormal paps.  Last mammogram: 09/2014  Last colonoscopy: 04/28/12, due again in 2023 Last DEXA: never  Dentist: twice yearly  Ophtho: 10/2014 Exercise: treadmill 2x/week (15 min twice daily), and small bicycle machine 2x/week.  Some skipping rope.  Only using handweights 1-2x/week.   Past Medical History  Diagnosis Date  . Allergic rhinitis, cause unspecified   . Pure hypercholesterolemia   . Chicken pox age 2    Past Surgical History  Procedure Laterality Date  . Tubal ligation    . Colonoscopy  04/2012    Dr. Olevia Perches.  Normal    Social History   Social History  . Marital Status: Widowed    Spouse Name: N/A  . Number of Children: 3  . Years of Education: N/A   Occupational History  . Not on file.   Social History Main Topics  . Smoking status: Never Smoker   . Smokeless tobacco: Never Used  . Alcohol Use: No  . Drug Use: No  . Sexual Activity: Not Currently   Other Topics Concern  . Not on file   Social History Narrative   Lives  with youngest son, middle son lives in [place], oldest son lives in [place].  Widowed 07/2011.  From [place]. Works seasonally for [employer] (from her home)    Family History  Problem Relation Age of Onset  . Diabetes Mother   . Hyperlipidemia Mother   . Cancer Father     lymphoma  . Heart disease Neg Hx     Outpatient Encounter Prescriptions as of 09/08/2015  Medication Sig Note  . cholecalciferol (VITAMIN D) 1000 UNITS tablet Take 1,000 Units by mouth daily. 09/08/2015: Takes it 3-4x/week  . Multiple Vitamins-Minerals (MULTIVITAMIN WITH MINERALS) tablet Take 1 tablet by mouth daily. 09/08/2015: Taking a powdered form of a MVI.  . [DISCONTINUED] Iron 66 MG TABS Take 1 tablet by mouth daily. 08/03/2013: Takes 3 times a week.   . [DISCONTINUED] loratadine (CLARITIN) 10 MG tablet Take 10 mg by mouth daily.   . [DISCONTINUED] vitamin B-12 (CYANOCOBALAMIN) 1000 MCG tablet Take 1,000 mcg by mouth daily. 08/03/2013: Takes 5 times per week   Facility-Administered Encounter Medications as of 09/08/2015  Medication  . 0.9 %  sodium chloride infusion    No Known Allergies   ROS: The patient denies anorexia, fever, headaches, vision changes, decreased hearing, ear pain, sore throat, breast concerns, chest pain, dizziness, syncope, dyspnea on exertion, cough, swelling, nausea, vomiting, diarrhea, constipation, abdominal pain, melena, hematochezia, indigestion/heartburn, hematuria, incontinence, dysuria, vaginal bleeding,  discharge, odor or itch,  genital lesions, joint pains, numbness, weakness, tremor, suspicious skin lesions, depression, anxiety, abnormal bleeding/bruising, or enlarged lymph nodes. Denies significant fatigue. 10# weight gain since last visit 2 years ago. Sometimes right before bedtime she feels her heart racing some. Still has some hot flashes, no night sweats. Last period was 07/2014. Tingling in her R 3rd and 4th fingers.  Arm rests on the edge of her desk when using the mouse, and she think  this is what is causing it--plans to move mouse.  PHYSICAL EXAM:  BP 150/100 mmHg  Pulse 83  Ht $R'5\' 5"'AV$  (1.651 m)  Wt 156 lb 9.6 oz (71.033 kg)  BMI 26.06 kg/m2  SpO2 99%  LMP 08/07/2014 154/84 on repeat by MD  General Appearance:  Alert, cooperative, no distress, appears stated age   Head:  Normocephalic, without obvious abnormality, atraumatic   Eyes:  PERRL, conjunctiva/corneas clear, EOM's intact, fundi  benign   Ears:  Normal TM's and external ear canals   Nose:  Nares normal, mucosa normal, no drainage or sinus tenderness   Throat:  Lips, mucosa, and tongue normal; teeth and gums normal.   Neck:  Supple, no lymphadenopathy; thyroid: no enlargement/tenderness/nodules; no carotid  bruit or JVD   Back:  Spine nontender, no curvature, ROM normal, no CVA tenderness   Lungs:  Clear to auscultation bilaterally without wheezes, rales or ronchi; respirations unlabored   Chest Wall:  No tenderness or deformity   Heart:  Regular rate and rhythm, S1 and S2 normal, no murmur, rub  or gallop   Breast Exam:  No tenderness, masses, or nipple discharge or inversion. No axillary lymphadenopathy   Abdomen:  Soft, non-tender, nondistended, normoactive bowel sounds,  no masses, no hepatosplenomegaly   Genitalia:  Normal external genitalia without lesions. BUS and vagina normal; No cervical motion tenderness. Uterus and adnexa not enlarged, nontender, no masses. Pap not performed   Rectal:  Normal tone, no masses or tenderness; guaiac negative stool   Extremities:  No clubbing, cyanosis or edema   Pulses:  2+ and symmetric all extremities   Skin:  Skin color, texture, turgor normal, no rashes or lesions.   Lymph nodes:  Cervical, supraclavicular, and axillary nodes normal   Neurologic:  CNII-XII intact, normal strength, sensation and gait; reflexes 2+ and symmetric throughout    Psych: Normal mood, affect, hygiene and grooming         ASSESSMENT/PLAN:  Annual physical exam - Plan: POCT Urinalysis Dipstick, Lipid panel, Comprehensive metabolic panel, Vit D  25 hydroxy (rtn osteoporosis monitoring), TSH  Pure hypercholesterolemia - low cholesterol diet reviewed - Plan: Lipid panel  Vitamin D deficiency - discussed supplements; will advise of recommendations based on results. - Plan: Vit D  25 hydroxy (rtn osteoporosis monitoring)  Elevated BP - low sodium diet reviewed; check BP and record; fu with monitor and BP record - Plan: Comprehensive metabolic panel  Need for hepatitis C screening test - Plan: Hepatitis C antibody  Weight gain - Plan: TSH   Elevated BP-- Start checking your blood pressures 2-3 times/week--vary the times, sometimes in the morning, sometimes in the evening.  Keep a chart with date/morning/evening/comments columns.  In the comments, include--stress, headache, tired, ate [place] or other salty food, anything that could contribute to your blood pressure (pain, dizziness). Bring your monitor and your list of blood pressures to your visit in 1 month. Try and get regular daily exercise and limit the sodium in your diet.  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  Chol, Vit D, c-met, TSH, hep C   Discussed monthly self breast exams and yearly mammograms; at least 30 minutes of aerobic activity at least 5 days/week, weight-bearing exercise 2x/week; proper sunscreen use reviewed; healthy diet, including goals of calcium and vitamin D intake and alcohol recommendations (less than or equal to 1 drink/day) reviewed; regular seatbelt use; changing batteries in smoke detectors. Immunization recommendations discussed--yearly flu shots recommended. Colonoscopy recommendations reviewed--UTD, due in 2023  F/u 4 weeks on BP; 1 year for CPE. Pap due next year.

## 2015-09-09 LAB — VITAMIN D 25 HYDROXY (VIT D DEFICIENCY, FRACTURES): Vit D, 25-Hydroxy: 24 ng/mL — ABNORMAL LOW (ref 30–100)

## 2015-09-09 MED ORDER — VITAMIN D (ERGOCALCIFEROL) 1.25 MG (50000 UNIT) PO CAPS: 50000.0000 [IU] | ORAL_CAPSULE | ORAL | Status: DC

## 2015-09-09 NOTE — Addendum Note (Signed)
Addended by: Joselyn ArrowKNAPP, Safira Proffit on: 09/09/2015 03:01 PM   Modules accepted: Orders

## 2015-09-22 ENCOUNTER — Other Ambulatory Visit: Payer: Self-pay

## 2015-09-22 DIAGNOSIS — Z1231 Encounter for screening mammogram for malignant neoplasm of breast: Secondary | ICD-10-CM

## 2015-10-06 ENCOUNTER — Ambulatory Visit (INDEPENDENT_AMBULATORY_CARE_PROVIDER_SITE_OTHER): Payer: No Typology Code available for payment source | Admitting: Family Medicine

## 2015-10-06 ENCOUNTER — Encounter: Payer: Self-pay | Admitting: Family Medicine

## 2015-10-06 VITALS — BP 138/80 | HR 76 | Ht 65.0 in | Wt 158.0 lb

## 2015-10-06 DIAGNOSIS — R030 Elevated blood-pressure reading, without diagnosis of hypertension: Secondary | ICD-10-CM | POA: Diagnosis not present

## 2015-10-06 DIAGNOSIS — E559 Vitamin D deficiency, unspecified: Secondary | ICD-10-CM

## 2015-10-06 DIAGNOSIS — Z23 Encounter for immunization: Secondary | ICD-10-CM | POA: Diagnosis not present

## 2015-10-06 DIAGNOSIS — E7800 Pure hypercholesterolemia, unspecified: Secondary | ICD-10-CM | POA: Diagnosis not present

## 2015-10-06 DIAGNOSIS — IMO0001 Reserved for inherently not codable concepts without codable children: Secondary | ICD-10-CM

## 2015-10-06 NOTE — Patient Instructions (Signed)
Continue to follow a low sodium diet.  Restart your daily exercise routine. Continue to check your blood pressure and keep a journal of the values. Bring the LIST and the MONITOR to your visit in a month. Based on the readings you brought today, they are above goal and medication is recommended.  Based on your recollection of what they were running when you were exercising, they seemed better, so I'm willing to give you a chance to prove that your numbers really are better than what this last week reflected before considering a blood pressure medication.  Change your OTC dose of vitamin D3 from 1000 IU to 5000 IU.  Take this daily for 3 months, and then you can decrease back to 2000 IU daily.  Stay at the 2000 IU dose long-term. Take this in addition to your multivitamin.  Cholesterol is a little higher than last year. Goal LDL is <130, and chol/HDL ratio <4. Please work on cutting back on cheese, butter, creamy things (soups, dressings, sauces, etc), egg yolks. Your HDL isn't as high as last year (making the ratio a little higher)--it is still good. Daily exercise keeps this up.  Fat and Cholesterol Restricted Diet High levels of fat and cholesterol in your blood may lead to various health problems, such as diseases of the heart, blood vessels, gallbladder, liver, and pancreas. Fats are concentrated sources of energy that come in various forms. Certain types of fat, including saturated fat, may be harmful in excess. Cholesterol is a substance needed by your body in small amounts. Your body makes all the cholesterol it needs. Excess cholesterol comes from the food you eat. When you have high levels of cholesterol and saturated fat in your blood, health problems can develop because the excess fat and cholesterol will gather along the walls of your blood vessels, causing them to narrow. Choosing the right foods will help you control your intake of fat and cholesterol. This will help keep the levels of  these substances in your blood within normal limits and reduce your risk of disease. WHAT IS MY PLAN? Your health care provider recommends that you:  Get no more than __________ % of the total calories in your daily diet from fat.  Limit your intake of saturated fat to less than ______% of your total calories each day.  Limit the amount of cholesterol in your diet to less than _________mg per day. WHAT TYPES OF FAT SHOULD I CHOOSE?  Choose healthy fats more often. Choose monounsaturated and polyunsaturated fats, such as olive and canola oil, flaxseeds, walnuts, almonds, and seeds.  Eat more omega-3 fats. Good choices include salmon, mackerel, sardines, tuna, flaxseed oil, and ground flaxseeds. Aim to eat fish at least two times a week.  Limit saturated fats. Saturated fats are primarily found in animal products, such as meats, butter, and cream. Plant sources of saturated fats include palm oil, palm kernel oil, and coconut oil.  Avoid foods with partially hydrogenated oils in them. These contain trans fats. Examples of foods that contain trans fats are stick margarine, some tub margarines, cookies, crackers, and other baked goods. WHAT GENERAL GUIDELINES DO I NEED TO FOLLOW? These guidelines for healthy eating will help you control your intake of fat and cholesterol:  Check food labels carefully to identify foods with trans fats or high amounts of saturated fat.  Fill one half of your plate with vegetables and green salads.  Fill one fourth of your plate with whole grains. Look for the word "  whole" as the first word in the ingredient list.  Fill one fourth of your plate with lean protein foods.  Limit fruit to two servings a day. Choose fruit instead of juice.  Eat more foods that contain soluble fiber. Examples of foods that contain this type of fiber are apples, broccoli, carrots, beans, peas, and barley. Aim to get 20-30 g of fiber per day.  Eat more home-cooked food and less  restaurant, buffet, and fast food.  Limit or avoid alcohol.  Limit foods high in starch and sugar.  Limit fried foods.  Cook foods using methods other than frying. Baking, boiling, grilling, and broiling are all great options.  Lose weight if you are overweight. Losing just 5-10% of your initial body weight can help your overall health and prevent diseases such as diabetes and heart disease. WHAT FOODS CAN I EAT? Grains Whole grains, such as whole wheat or whole grain breads, crackers, cereals, and pasta. Unsweetened oatmeal, bulgur, barley, quinoa, or brown rice. Corn or whole wheat flour tortillas. Vegetables Fresh or frozen vegetables (raw, steamed, roasted, or grilled). Green salads. Fruits All fresh, canned (in natural juice), or frozen fruits. Meat and Other Protein Products Ground beef (85% or leaner), grass-fed beef, or beef trimmed of fat. Skinless chicken or [place]. Ground chicken or [place]. Pork trimmed of fat. All fish and seafood. Eggs. Dried beans, peas, or lentils. Unsalted nuts or seeds. Unsalted canned or dry beans. Dairy Low-fat dairy products, such as skim or 1% milk, 2% or reduced-fat cheeses, low-fat ricotta or cottage cheese, or plain low-fat yogurt. Fats and Oils Tub margarines without trans fats. Light or reduced-fat mayonnaise and salad dressings. Avocado. Olive, canola, sesame, or safflower oils. Natural peanut or almond butter (choose ones without added sugar and oil). The items listed above may not be a complete list of recommended foods or beverages. Contact your dietitian for more options. WHAT FOODS ARE NOT RECOMMENDED? Grains White bread. White pasta. White rice. Cornbread. Bagels, pastries, and croissants. Crackers that contain trans fat. Vegetables White potatoes. Corn. Creamed or fried vegetables. Vegetables in a cheese sauce. Fruits Dried fruits. Canned fruit in light or heavy syrup. Fruit juice. Meat and Other Protein Products Fatty cuts of meat.  Ribs, chicken wings, bacon, sausage, bologna, salami, chitterlings, fatback, hot dogs, bratwurst, and packaged luncheon meats. Liver and organ meats. Dairy Whole or 2% milk, cream, half-and-half, and cream cheese. Whole milk cheeses. Whole-fat or sweetened yogurt. Full-fat cheeses. Nondairy creamers and whipped toppings. Processed cheese, cheese spreads, or cheese curds. Sweets and Desserts Corn syrup, sugars, honey, and molasses. Candy. Jam and jelly. Syrup. Sweetened cereals. Cookies, pies, cakes, donuts, muffins, and ice cream. Fats and Oils Butter, stick margarine, lard, shortening, ghee, or bacon fat. Coconut, palm kernel, or palm oils. Beverages Alcohol. Sweetened drinks (such as sodas, lemonade, and fruit drinks or punches). The items listed above may not be a complete list of foods and beverages to avoid. Contact your dietitian for more information.   This information is not intended to replace advice given to you by your health care provider. Make sure you discuss any questions you have with your health care provider.   Document Released: 10/25/2005 Document Revised: 11/15/2014 Document Reviewed: 01/23/2014 Elsevier Interactive Patient Education [employer]

## 2015-10-06 NOTE — Progress Notes (Signed)
Chief Complaint  Patient presents with  . Hypertension    follow up. Did not pick up vitamin D rx(read MyChart message incorrectly) BUT, in unsure that she wants to take the rx, last time took two weeks of the rx and began getting boils on her "seat." She stopped the rx the boils went away.    Patient presents to follow up on her blood pressure. She had been exercising regularly, up until the holidays--eating more, exercising less just over the last week.  She was exercising (aerobics, weight lifting and treadmill) for an hour every day.    She didn't bring the list of BP's for when she was eating right and exercising. She remembers seeing some 120's-130's on the days she was exercising. She brings in a list from the week of 11/20, when she hadn't been exercising. BP's running 143-157/87-96.  Just once had 134/84 (last night night).  We reviewed her lab results--there was a misunderstanding in reading her MyChart results, and she never picked up the rx Vitamin D.  She also reports that she prefers not to take it--She recalls getting painful boils when taking rx Vitamin D the last time it was prescribed, so never finished the course.  She doesn't want to take rx D again (as was recommended after recent labs). She has been taking 1000 IU daily.  PMH, PSH, SH reviewed.  Outpatient Encounter Prescriptions as of 10/06/2015  Medication Sig Note  . cholecalciferol (VITAMIN D) 1000 UNITS tablet Take 1,000 Units by mouth daily. 09/08/2015: Takes it 3-4x/week  . Multiple Vitamins-Minerals (MULTIVITAMIN WITH MINERALS) tablet Take 1 tablet by mouth daily. 09/08/2015: Taking a powdered form of a MVI.  Marland Kitchen. Vitamin D, Ergocalciferol, (DRISDOL) 50000 UNITS CAPS capsule Take 1 capsule (50,000 Units total) by mouth every 7 (seven) days. (Patient not taking: Reported on 10/06/2015)    Facility-Administered Encounter Medications as of 10/06/2015  Medication  . 0.9 %  sodium chloride infusion   No Known  Allergies  ROS: no fever, chills, headaches, dizziness, chest pain, palpitations, shortness of breath, cough, edema, GI or GU complaints.  +Mild seasonal allergies  PHYSICAL EXAM: BP 148/84 mmHg  Pulse 76  Ht 5\' 5"  (1.651 m)  Wt 158 lb (71.668 kg)  BMI 26.29 kg/m2  LMP 08/07/2014 138/80 on repeat by MD  Well developed, pleasant female in no distress HEENT: PERRL, EOMI, conjunctiva clear Neck: no lymphadenopathy or mass Heart: regular rate and rhythm without murmur Lungs: clear bilaterally Extremities: no edema, 2+ pulses Neuro: alert and oriented, cranial nerves intact, normal gait, strength Psych: normal mood, affect, hygiene and grooing   Lab Results  Component Value Date   CHOL 225* 09/08/2015   HDL 54 09/08/2015   LDLCALC 152* 09/08/2015   TRIG 96 09/08/2015   CHOLHDL 4.2 09/08/2015   Vitamin D-OH 24    Chemistry      Component Value Date/Time   NA 139 09/08/2015 0001   K 4.0 09/08/2015 0001   CL 100 09/08/2015 0001   CO2 28 09/08/2015 0001   BUN 10 09/08/2015 0001   CREATININE 0.70 09/08/2015 0001      Component Value Date/Time   CALCIUM 9.6 09/08/2015 0001   ALKPHOS 98 09/08/2015 0001   AST 19 09/08/2015 0001   ALT 17 09/08/2015 0001   BILITOT 1.2 09/08/2015 0001     Glucose 88  Lab Results  Component Value Date   TSH 0.891 09/08/2015   Hepatitis C Ab negative  ASSESSMENT/PLAN:  Elevated BP -  remains elevated, reportedly lower when exercising. Continue to monitor and f/u with list of BP's in 6-8 wks. Cont low sodium diet  Need for prophylactic vaccination and inoculation against influenza - Plan: Flu Vaccine QUAD 36+ mos PF IM (Fluarix & Fluzone Quad PF)  Vitamin D deficiency - declines rx Vit D.  Take 5000 IU daily x 3 months, then 2000 IU daily (vs 5000 TIW)  Pure hypercholesterolemia - low cholesterol diet and goals reviewed   Continue to follow a low sodium diet.  Restart your daily exercise routine. Continue to check your blood  pressure and keep a journal of the values. Bring the LIST and the MONITOR to your visit in a month. Based on the readings you brought today, they are above goal and medication is recommended.  Based on your recollection of what they were running when you were exercising, they seemed better, so I'm willing to give you a chance to prove that your numbers really are better than what this last week reflected before considering a blood pressure medication.  Change your OTC dose of vitamin D3 from 1000 IU to 5000 IU.  Take this daily for 3 months, and then you can decrease back to 2000 IU daily.  Stay at the 2000 IU dose long-term. Take this in addition to your multivitamin.  Cholesterol is a little higher than last year. Goal LDL is <130, and chol/HDL ratio <4. Please work on cutting back on cheese, butter, creamy things (soups, dressings, sauces, etc), egg yolks. Your HDL isn't as high as last year (making the ratio a little higher)--it is still good. Daily exercise keeps this up.   Reviewed low cholesterol diet. Goal LDL <130  25-30 min visit, more than 1/2 spent counseling

## 2015-10-14 ENCOUNTER — Ambulatory Visit
Admission: RE | Admit: 2015-10-14 | Discharge: 2015-10-14 | Disposition: A | Discharge status: Home / Self Care | Payer: No Typology Code available for payment source | Source: Ambulatory Visit

## 2015-10-14 DIAGNOSIS — Z1231 Encounter for screening mammogram for malignant neoplasm of breast: Secondary | ICD-10-CM

## 2015-12-08 ENCOUNTER — Ambulatory Visit: Payer: No Typology Code available for payment source | Admitting: Family Medicine

## 2016-02-02 IMAGING — MG MM DIGITAL SCREENING BILAT
4 series · 4 of 4 positions shown · non-contrast
Comparison: Previous exam(s).

CLINICAL DATA: Screening.

EXAM:
DIGITAL SCREENING BILATERAL MAMMOGRAM WITH CAD

[L CC]
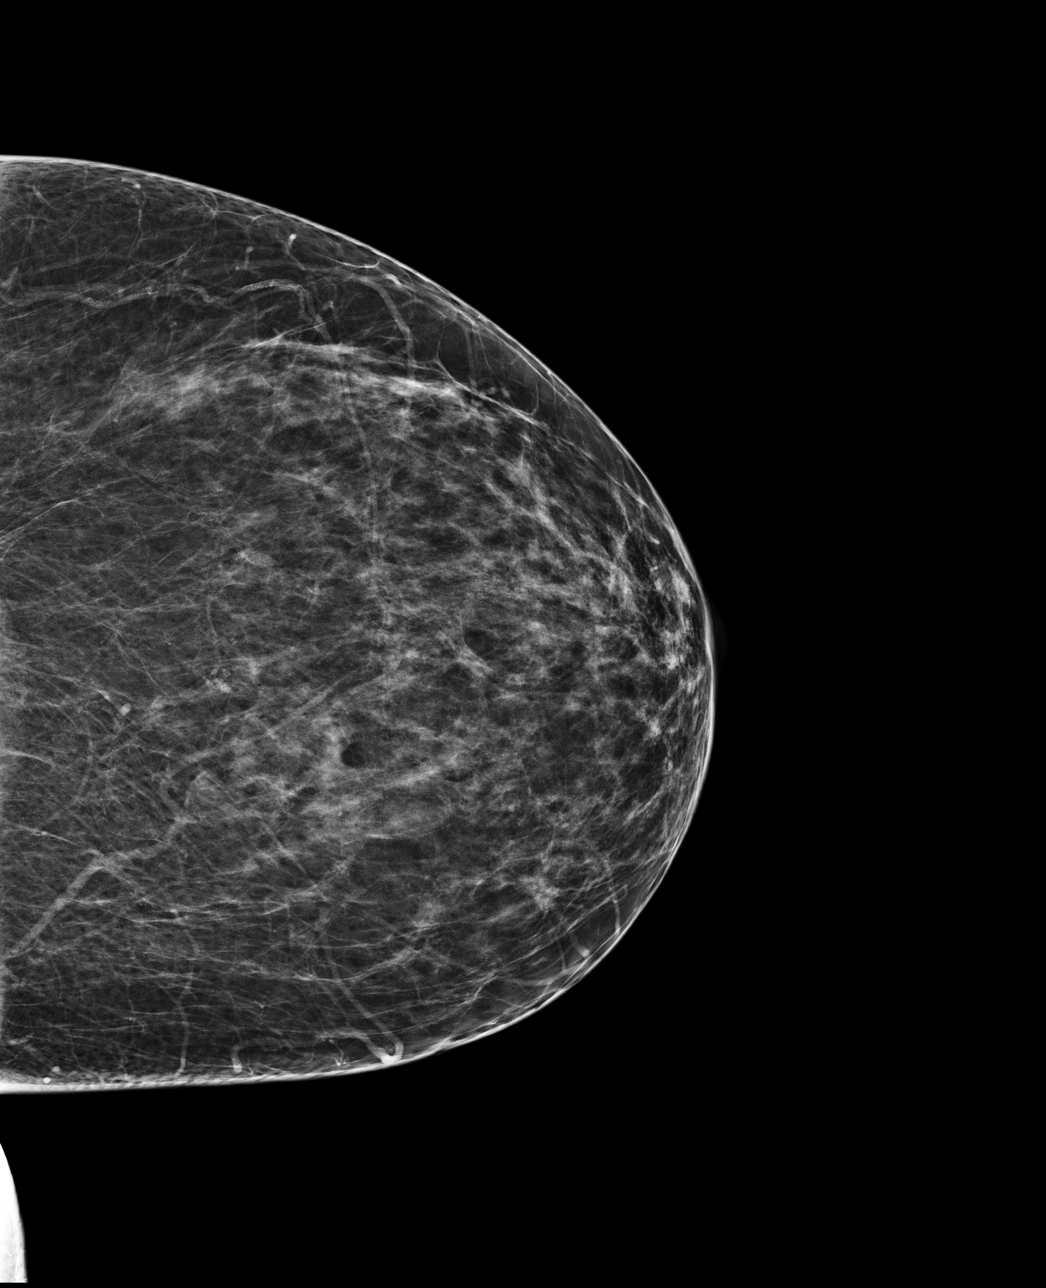

[R CC]
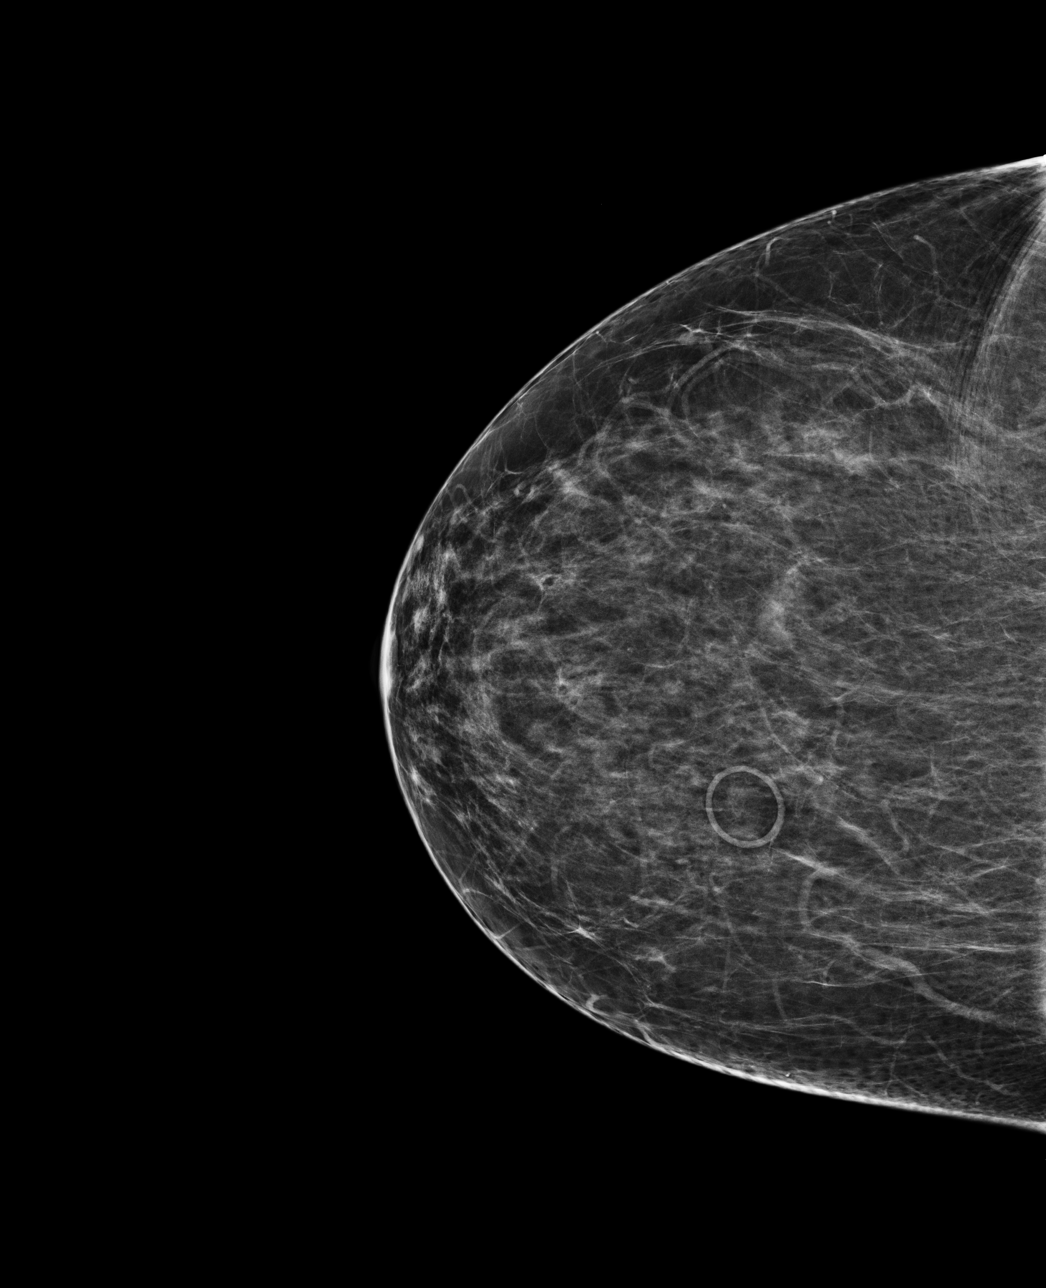

[L MLO]
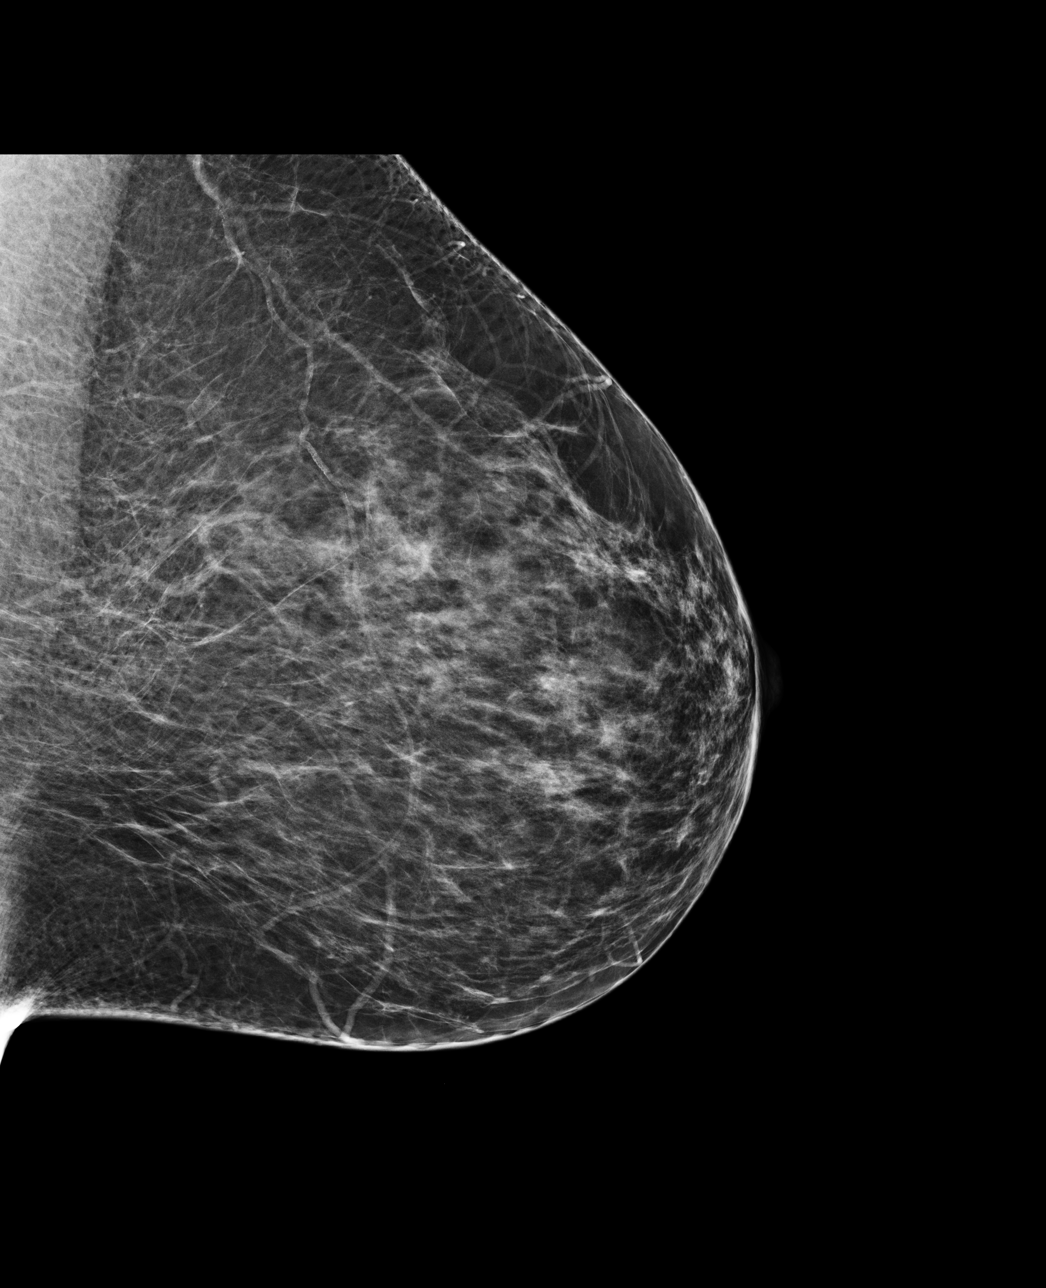

[R MLO]
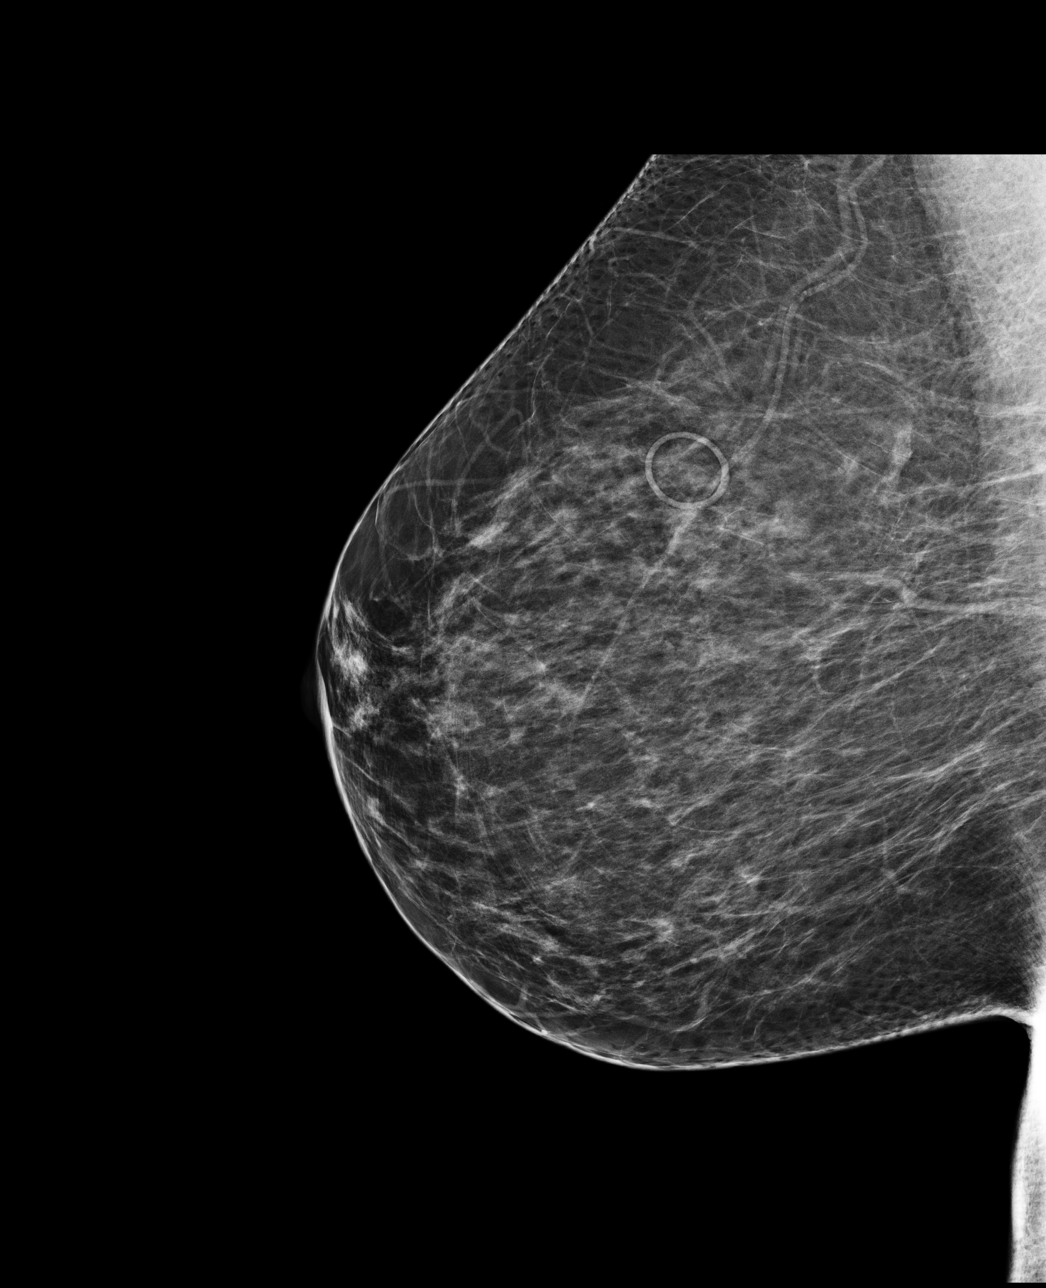

[4 of 4 positions shown; findings below may reference images not displayed]

ACR Breast Density Category b: There are scattered areas of
fibroglandular density.
FINDINGS: There are no findings suspicious for malignancy. Images were
processed with CAD.
IMPRESSION: No mammographic evidence of malignancy. A result letter of this
screening mammogram will be mailed directly to the patient.

RECOMMENDATION:
Screening mammogram in one year. (Code:AS-G-LCT)

BI-RADS CATEGORY  1: Negative.

## 2016-12-30 ENCOUNTER — Other Ambulatory Visit: Payer: Self-pay | Admitting: Family Medicine

## 2016-12-30 DIAGNOSIS — Z1231 Encounter for screening mammogram for malignant neoplasm of breast: Secondary | ICD-10-CM

## 2017-01-03 ENCOUNTER — Encounter: Payer: Self-pay | Admitting: Family Medicine

## 2017-01-03 ENCOUNTER — Ambulatory Visit (INDEPENDENT_AMBULATORY_CARE_PROVIDER_SITE_OTHER): Payer: BLUE CROSS/BLUE SHIELD | Admitting: Family Medicine

## 2017-01-03 ENCOUNTER — Other Ambulatory Visit: Payer: Self-pay

## 2017-01-03 ENCOUNTER — Other Ambulatory Visit: Payer: Self-pay | Admitting: *Deleted

## 2017-01-03 VITALS — BP 154/80 | HR 80 | Ht 65.0 in | Wt 153.4 lb

## 2017-01-03 DIAGNOSIS — R030 Elevated blood-pressure reading, without diagnosis of hypertension: Secondary | ICD-10-CM

## 2017-01-03 DIAGNOSIS — N6320 Unspecified lump in the left breast, unspecified quadrant: Secondary | ICD-10-CM | POA: Diagnosis not present

## 2017-01-03 DIAGNOSIS — Z23 Encounter for immunization: Secondary | ICD-10-CM

## 2017-01-03 DIAGNOSIS — E7800 Pure hypercholesterolemia, unspecified: Secondary | ICD-10-CM

## 2017-01-03 DIAGNOSIS — E559 Vitamin D deficiency, unspecified: Secondary | ICD-10-CM

## 2017-01-03 NOTE — Patient Instructions (Signed)
Monitor your blood pressure at home. Keep a chart with columns for the date/morning/evening/comments. In the comments--put if after exercise, if you felt bad (headache, dizzy), if you had a salty meal, were sick, etc. It is best not to check immediately after exercise, but at least 15-20 minutes after. Bring in your monitor and this list to you next visit. Try and follow a low sodium diet and continue to get regular exercise.  DASH Eating Plan DASH stands for "Dietary Approaches to Stop Hypertension." The DASH eating plan is a healthy eating plan that has been shown to reduce high blood pressure (hypertension). Additional health benefits may include reducing the risk of type 2 diabetes mellitus, heart disease, and stroke. The DASH eating plan may also help with weight loss. What do I need to know about the DASH eating plan? For the DASH eating plan, you will follow these general guidelines:  Choose foods with less than 150 milligrams of sodium per serving (as listed on the food label).  Use salt-free seasonings or herbs instead of table salt or sea salt.  Check with your health care provider or pharmacist before using salt substitutes.  Eat lower-sodium products. These are often labeled as "low-sodium" or "no salt added."  Eat fresh foods. Avoid eating a lot of canned foods.  Eat more vegetables, fruits, and low-fat dairy products.  Choose whole grains. Look for the word "whole" as the first word in the ingredient list.  Choose fish and skinless chicken or [place] more often than red meat. Limit fish, poultry, and meat to 6 oz (170 g) each day.  Limit sweets, desserts, sugars, and sugary drinks.  Choose heart-healthy fats.  Eat more home-cooked food and less restaurant, buffet, and fast food.  Limit fried foods.  Do not fry foods. Cook foods using methods such as baking, boiling, grilling, and broiling instead.  When eating at a restaurant, ask that your food be prepared with  less salt, or no salt if possible. What foods can I eat? Seek help from a dietitian for individual calorie needs. Grains  Whole grain or whole wheat bread. Brown rice. Whole grain or whole wheat pasta. Quinoa, bulgur, and whole grain cereals. Low-sodium cereals. Corn or whole wheat flour tortillas. Whole grain cornbread. Whole grain crackers. Low-sodium crackers. Vegetables  Fresh or frozen vegetables (raw, steamed, roasted, or grilled). Low-sodium or reduced-sodium tomato and vegetable juices. Low-sodium or reduced-sodium tomato sauce and paste. Low-sodium or reduced-sodium canned vegetables. Fruits  All fresh, canned (in natural juice), or frozen fruits. Meat and Other Protein Products  Ground beef (85% or leaner), grass-fed beef, or beef trimmed of fat. Skinless chicken or [place]. Ground chicken or [place]. Pork trimmed of fat. All fish and seafood. Eggs. Dried beans, peas, or lentils. Unsalted nuts and seeds. Unsalted canned beans. Dairy  Low-fat dairy products, such as skim or 1% milk, 2% or reduced-fat cheeses, low-fat ricotta or cottage cheese, or plain low-fat yogurt. Low-sodium or reduced-sodium cheeses. Fats and Oils  Tub margarines without trans fats. Light or reduced-fat mayonnaise and salad dressings (reduced sodium). Avocado. Safflower, olive, or canola oils. Natural peanut or almond butter. Other  Unsalted popcorn and pretzels. The items listed above may not be a complete list of recommended foods or beverages. Contact your dietitian for more options.  What foods are not recommended? Grains  White bread. White pasta. White rice. Refined cornbread. Bagels and croissants. Crackers that contain trans fat. Vegetables  Creamed or fried vegetables. Vegetables in a cheese sauce. Regular  canned vegetables. Regular canned tomato sauce and paste. Regular tomato and vegetable juices. Fruits  Canned fruit in light or heavy syrup. Fruit juice. Meat and Other Protein Products  Fatty cuts of  meat. Ribs, chicken wings, bacon, sausage, bologna, salami, chitterlings, fatback, hot dogs, bratwurst, and packaged luncheon meats. Salted nuts and seeds. Canned beans with salt. Dairy  Whole or 2% milk, cream, half-and-half, and cream cheese. Whole-fat or sweetened yogurt. Full-fat cheeses or blue cheese. Nondairy creamers and whipped toppings. Processed cheese, cheese spreads, or cheese curds. Condiments  Onion and garlic salt, seasoned salt, table salt, and sea salt. Canned and packaged gravies. Worcestershire sauce. Tartar sauce. Barbecue sauce. Teriyaki sauce. Soy sauce, including reduced sodium. Steak sauce. Fish sauce. Oyster sauce. Cocktail sauce. Horseradish. Ketchup and mustard. Meat flavorings and tenderizers. Bouillon cubes. Hot sauce. Tabasco sauce. Marinades. Taco seasonings. Relishes. Fats and Oils  Butter, stick margarine, lard, shortening, ghee, and bacon fat. Coconut, palm kernel, or palm oils. Regular salad dressings. Other  Pickles and olives. Salted popcorn and pretzels. The items listed above may not be a complete list of foods and beverages to avoid. Contact your dietitian for more information.  Where can I find more information? National [employer], [employer], [employer] Institute: [employer] This information is not intended to replace advice given to you by your health care provider. Make sure you discuss any questions you have with your health care provider. Document Released: 10/14/2011 Document Revised: 04/01/2016 Document Reviewed: 08/29/2013 Elsevier Interactive Patient Education  2017 [employer].

## 2017-01-03 NOTE — Progress Notes (Signed)
Chief Complaint  Patient presents with  . Breast Mass    lump on left breast, noticed on 12/26/16, started out itchy and then became painful.    First noted an area on the left inferior breast was itchy 2/18.  On 2/21, the area became raised, red and painful.  She has been applying some warm compresses.  The area is now less red, less painful--still slightly sore. There was never any drainage. The area has reduced in size.  Denies any other breast concerns--no lumps, nipple discharge/drainage.  She is scheduled for mammogram 3/9, doesn't believe it is 3D.  She just exercised on her exercise bike prior to visit.  Denies h/o hypertension, but has had elevated BP's noted on last visit.  She hasn't been seen in over a year.  Noted in 08/2015 to have low vitamin D, and elevated lipids, as well as elevated BP  PMH, PSH, SH and FH updated  Outpatient Encounter Prescriptions as of 01/03/2017  Medication Sig Note  . cholecalciferol (VITAMIN D) 1000 UNITS tablet Take 1,000 Units by mouth daily. 09/08/2015: Takes it 3-4x/week  . cyanocobalamin 500 MCG tablet Take 500 mcg by mouth daily.   . Multiple Vitamins-Minerals (MULTIVITAMIN WITH MINERALS) tablet Take 1 tablet by mouth daily. 09/08/2015: Taking a powdered form of a MVI.  . [DISCONTINUED] Vitamin D, Ergocalciferol, (DRISDOL) 50000 UNITS CAPS capsule Take 1 capsule (50,000 Units total) by mouth every 7 (seven) days. (Patient not taking: Reported on 10/06/2015)    Facility-Administered Encounter Medications as of 01/03/2017  Medication  . 0.9 %  sodium chloride infusion    ROS: No known fever, chills. No URI symptoms, no cough, shortness of breath, chest pain, palpitations, headache, dizziness, other skin concerns (just as noted on left breast).  PHYSICAL EXAM:  BP (!) 160/100 (BP Location: Left Arm, Patient Position: Sitting, Cuff Size: Normal)   Pulse 80   Ht 5' 5"  (1.651 m)   Wt 153 lb 6.4 oz (69.6 kg)   LMP 08/08/2015 (Approximate)   BMI  25.53 kg/m   154/80 on repeat by MD  Well developed, pleasant female in no distress. Doesn't appear anxious. Left breast--there is an area at 7 o'clock position on the left breast that is somewhat hyperpigmented, raised, and slightly firm.  Measures 2-2.5 x 1.5cm in size.  There is no fluctuance.  Minimally firm/indurated, minimally tender.  The area mostly feels soft, and connected to the skin.  Full breast exam was performed and was otherwise unremarkable.  No axillary lymphadenopathy Neck: no lymphadenopathy or mass Heart: regular rate and rhythm without murmur Lungs: clear bilaterally Extremities: no edema Neuro: alert and oriented, cranial nerves intact, normal strength, gait Psych: normal mood, affect, hygiene and grooming  ASSESSMENT/PLAN:  Left breast mass - suspect cyst that is resolving.  Past due for mammo--schedule for diagnostic mammo  Need for influenza vaccination - Plan: Flu Vaccine QUAD 36+ mos PF IM (Fluarix & Fluzone Quad PF)  Elevated blood pressure reading in office without diagnosis of hypertension - counseled re: low sodium diet; monitor elsewhere,  bring list and monitor to f/u in 4-6 weeks    Change to diagnostic mammo left breast, 3D screening on right vs diagnostic bilaterally. Cancel one that is scheduled for the 3/9. Call her with appointment.   F/u 4-6 weeks Fasting labs prior--c-,met, Vit D, CBC, lipid, TSH H/o Vit D deficiency and elevated lipids.   Monitor your blood pressure at home. Keep a chart with columns for the date/morning/evening/comments. In the  comments--put if after exercise, if you felt bad (headache, dizzy), if you had a salty meal, were sick, etc. It is best not to check immediately after exercise, but at least 15-20 minutes after. Bring in your monitor and this list to you next visit. Try and follow a low sodium diet and continue to get regular exercise.

## 2017-01-04 ENCOUNTER — Ambulatory Visit
Admission: RE | Admit: 2017-01-04 | Discharge: 2017-01-04 | Disposition: A | Discharge status: Home / Self Care | Payer: BLUE CROSS/BLUE SHIELD | Source: Ambulatory Visit | Attending: Family Medicine | Admitting: Family Medicine

## 2017-01-04 DIAGNOSIS — N6320 Unspecified lump in the left breast, unspecified quadrant: Secondary | ICD-10-CM

## 2017-01-14 ENCOUNTER — Ambulatory Visit: Payer: No Typology Code available for payment source

## 2017-01-31 ENCOUNTER — Other Ambulatory Visit: Payer: BLUE CROSS/BLUE SHIELD

## 2017-01-31 DIAGNOSIS — E559 Vitamin D deficiency, unspecified: Secondary | ICD-10-CM

## 2017-01-31 DIAGNOSIS — E7800 Pure hypercholesterolemia, unspecified: Secondary | ICD-10-CM

## 2017-01-31 DIAGNOSIS — R030 Elevated blood-pressure reading, without diagnosis of hypertension: Secondary | ICD-10-CM

## 2017-01-31 LAB — CBC WITH DIFFERENTIAL/PLATELET
BASOS ABS: 0 {cells}/uL (ref 0–200)
Basophils Relative: 0 %
EOS ABS: 70 {cells}/uL (ref 15–500)
Eosinophils Relative: 1 %
HEMATOCRIT: 38.2 % (ref 35.0–45.0)
Hemoglobin: 12.8 g/dL (ref 11.7–15.5)
Lymphocytes Relative: 25 %
Lymphs Abs: 1750 cells/uL (ref 850–3900)
MCH: 27.8 pg (ref 27.0–33.0)
MCHC: 33.5 g/dL (ref 32.0–36.0)
MCV: 82.9 fL (ref 80.0–100.0)
MONO ABS: 350 {cells}/uL (ref 200–950)
MONOS PCT: 5 %
MPV: 10.3 fL (ref 7.5–12.5)
NEUTROS PCT: 69 %
Neutro Abs: 4830 cells/uL (ref 1500–7800)
PLATELETS: 311 10*3/uL (ref 140–400)
RBC: 4.61 MIL/uL (ref 3.80–5.10)
RDW: 14.8 % (ref 11.0–15.0)
WBC: 7 10*3/uL (ref 4.0–10.5)

## 2017-01-31 LAB — COMPREHENSIVE METABOLIC PANEL
ALBUMIN: 4.2 g/dL (ref 3.6–5.1)
ALT: 11 U/L (ref 6–29)
AST: 15 U/L (ref 10–35)
Alkaline Phosphatase: 84 U/L (ref 33–130)
BILIRUBIN TOTAL: 1.3 mg/dL — AB (ref 0.2–1.2)
BUN: 12 mg/dL (ref 7–25)
CO2: 28 mmol/L (ref 20–31)
CREATININE: 0.66 mg/dL (ref 0.50–1.05)
Calcium: 9.6 mg/dL (ref 8.6–10.4)
Chloride: 106 mmol/L (ref 98–110)
Glucose, Bld: 92 mg/dL (ref 65–99)
Potassium: 4.4 mmol/L (ref 3.5–5.3)
Sodium: 142 mmol/L (ref 135–146)
TOTAL PROTEIN: 7.1 g/dL (ref 6.1–8.1)

## 2017-01-31 LAB — TSH: TSH: 2.1 mIU/L

## 2017-01-31 LAB — LIPID PANEL
Cholesterol: 231 mg/dL — ABNORMAL HIGH (ref <200)
HDL: 52 mg/dL (ref >50)
LDL Cholesterol: 158 mg/dL — ABNORMAL HIGH (ref <100)
Total CHOL/HDL Ratio: 4.4 Ratio (ref <5.0)
Triglycerides: 103 mg/dL (ref <150)
VLDL: 21 mg/dL (ref <30)

## 2017-02-01 LAB — VITAMIN D 25 HYDROXY (VIT D DEFICIENCY, FRACTURES): Vit D, 25-Hydroxy: 30 ng/mL (ref 30–100)

## 2017-02-02 NOTE — Progress Notes (Signed)
Chief Complaint  Patient presents with  . Follow-up    1 month follow up on bp.    She presents to follow up on elevated blood pressure.  This was noted again when here recently for a breast cyst.  She previously had elevated BP's noted over a year ago, but didn't follow up as directed. She had labs prior to her visit today, see below.  BP's at home are ranging from 136-151/83-92; averaging low 140's/high 80's-90. Brought monitor but not cuff portion so can't verify accuracy of monitor.  She brings in her record of BPs, where she has also recorded her diet, which was reviewed.  Some high sodium foods, and significant red meat intake noted.  h/o Vitamin D deficiency:  Level of 24 in 08/2015.  She recalls getting painful boils from rx Vitamin D, so declined repeat Rx.  Has been taking 1000 IU daily.  Hyperlipidemia:  Eats red meat 3x/week. Eggs once a week.  No creamy sauces/soups. Lowfat mayo in her tuna salad. Labs done prior to visit, see below.  Allergies are flaring.  She used Afrin last night  PMH, PSH, SH reviewed  Current Outpatient Prescriptions on File Prior to Visit  Medication Sig Dispense Refill  . cholecalciferol (VITAMIN D) 1000 UNITS tablet Take 1,000 Units by mouth daily.    . cyanocobalamin 500 MCG tablet Take 500 mcg by mouth daily.    . Multiple Vitamins-Minerals (MULTIVITAMIN WITH MINERALS) tablet Take 1 tablet by mouth daily.     Current Facility-Administered Medications on File Prior to Visit  Medication Dose Route Frequency Provider Last Rate Last Dose  . 0.9 %  sodium chloride infusion  500 mL Intravenous Continuous Lafayette Dragon, MD       No Known Allergies  ROS: no fever, chills, URI symptoms, headaches, dizziness, chest pain, shortness of breath, edema.  +allergies/congestion per HPI.  No GI or GU complaints. Moods are egood  PHYSICAL EXAM:  BP (!) 152/96 (BP Location: Left Arm, Patient Position: Sitting, Cuff Size: Normal)   Pulse 72   Ht 5' 5"   (1.651 m)   Wt 152 lb (68.9 kg)   LMP 08/08/2015 (Approximate)   BMI 25.29 kg/m   164/88 on repeat by MD   Wt Readings from Last 3 Encounters:  01/03/17 153 lb 6.4 oz (69.6 kg)  10/06/15 158 lb (71.7 kg)  09/08/15 156 lb 9.6 oz (71 kg)   Well appearing, pleasant female in no distress HEENT: conjunctiva and sclera are clear EOMI, OP clear, sinuses nontender Neck: no lymphadenopathy, thyromegaly or mass, no bruit Heart: regular rate and rhythm, no murmur Lungs: clear bilaterally Abdomen: soft, nontender, no mass or bruit Extremities: no edema, 2+ pulses Neuro: alert and oriented, normal strength, cranial nerves, gait Psych: normal mood, affect, hygiene and grooming   Lab Results  Component Value Date   CHOL 231 (H) 01/31/2017   HDL 52 01/31/2017   LDLCALC 158 (H) 01/31/2017   TRIG 103 01/31/2017   CHOLHDL 4.4 01/31/2017     Chemistry      Component Value Date/Time   NA 142 01/31/2017 0830   K 4.4 01/31/2017 0830   CL 106 01/31/2017 0830   CO2 28 01/31/2017 0830   BUN 12 01/31/2017 0830   CREATININE 0.66 01/31/2017 0830      Component Value Date/Time   CALCIUM 9.6 01/31/2017 0830   ALKPHOS 84 01/31/2017 0830   AST 15 01/31/2017 0830   ALT 11 01/31/2017 0830   BILITOT 1.3 (  H) 01/31/2017 0830     Lab Results  Component Value Date   TSH 2.10 01/31/2017   Lab Results  Component Value Date   WBC 7.0 01/31/2017   HGB 12.8 01/31/2017   HCT 38.2 01/31/2017   MCV 82.9 01/31/2017   PLT 311 01/31/2017   Vitamin D-OH 30  ASSESSMENT/PLAN:  Essential hypertension - new dx; reviewed various medication options, prefers to start with HCTZ. Risks/side effects and K+-rich foods discussed. b-met in 3wks; OV 3 mos. - Plan: hydrochlorothiazide (HYDRODIURIL) 25 MG tablet  Hypercholesterolemia - goal LDL<130 given new risk factor of HTN. Can make dietary improvements--recheck 3 mos, start meds if still above goal  Vitamin D deficiency - adequately replaced (borderline);  continue daily supplement  Medication monitoring encounter - Plan: Basic metabolic panel   Discussed lisinopril, losartan, diuretics, the possibility of combo med in future.  Briefly discussed amlodipine--not first line.  b-met in 3 weeks Lipids and b-met in 3 mos  Start HCTZ every morning.  Continue to monitor your blood pressure. Start taking just 1/2 tablet in the morning for the first week. If your blood pressure remains >135-140/85-90 after the first week, then increase to the full tablet.  Remember to continue daily banana.  Contact us if having side effects or problems. Return with your monitor for a nurse visit in 3 weeks.  Bring your list of blood pressures to that visit. Put on that paper if/when you start the medication and increase the dose.   Use claritin, zyrtec or allegra daily for allergies.  Don't use D versions or any decongestants (they raise blood pressure). Consider using Flonase daily for allergies. Use Afrin only very sparingly.  Cut back on the red meat to 1-2x/week as we discussed. Continue to limit sodium in your diet.  Continue your daily vitamin D of at least 1000 IU.

## 2017-02-03 ENCOUNTER — Encounter: Payer: Self-pay | Admitting: Family Medicine

## 2017-02-03 ENCOUNTER — Ambulatory Visit (INDEPENDENT_AMBULATORY_CARE_PROVIDER_SITE_OTHER): Payer: BLUE CROSS/BLUE SHIELD | Admitting: Family Medicine

## 2017-02-03 VITALS — BP 152/96 | HR 72 | Ht 65.0 in | Wt 152.0 lb

## 2017-02-03 DIAGNOSIS — E559 Vitamin D deficiency, unspecified: Secondary | ICD-10-CM

## 2017-02-03 DIAGNOSIS — I10 Essential (primary) hypertension: Secondary | ICD-10-CM | POA: Diagnosis not present

## 2017-02-03 DIAGNOSIS — Z5181 Encounter for therapeutic drug level monitoring: Secondary | ICD-10-CM | POA: Diagnosis not present

## 2017-02-03 DIAGNOSIS — E7800 Pure hypercholesterolemia, unspecified: Secondary | ICD-10-CM | POA: Diagnosis not present

## 2017-02-03 MED ORDER — HYDROCHLOROTHIAZIDE 25 MG PO TABS: 25.0000 mg | ORAL_TABLET | Freq: Every day | ORAL | 3 refills | Status: DC

## 2017-02-03 NOTE — Patient Instructions (Signed)
Start HCTZ every morning.  Continue to monitor your blood pressure. Start taking just 1/2 tablet in the morning for the first week. If your blood pressure remains >135-140/85-90 after the first week, then increase to the full tablet.  Remember to continue daily banana.  Contact us if having side effects or problems. Return with your monitor for a nurse visit in 3 weeks.  Bring your list of blood pressures to that visit. Put on that paper if/when you start the medication and increase the dose.   Use claritin, zyrtec or allegra daily for allergies.  Don't use D versions or any decongestants (they raise blood pressure). Consider using Flonase daily for allergies. Use Afrin only very sparingly.  Cut back on the red meat to 1-2x/week as we discussed. Continue to limit sodium in your diet.  Continue your daily vitamin D of at least 1000 IU.    Cholesterol Cholesterol is a white, waxy, fat-like substance that is needed by the human body in small amounts. The liver makes all the cholesterol we need. Cholesterol is carried from the liver by the blood through the blood vessels. Deposits of cholesterol (plaques) may build up on blood vessel (artery) walls. Plaques make the arteries narrower and stiffer. Cholesterol plaques increase the risk for heart attack and stroke. You cannot feel your cholesterol level even if it is very high. The only way to know that it is high is to have a blood test. Once you know your cholesterol levels, you should keep a record of the test results. Work with your health care provider to keep your levels in the desired range. What do the results mean?  Total cholesterol is a rough measure of all the cholesterol in your blood.  LDL (low-density lipoprotein) is the "bad" cholesterol. This is the type that causes plaque to build up on the artery walls. You want this level to be low.  HDL (high-density lipoprotein) is the "good" cholesterol because it cleans the arteries and  carries the LDL away. You want this level to be high.  Triglycerides are fat that the body can either burn for energy or store. High levels are closely linked to heart disease. What are the desired levels of cholesterol?  Total cholesterol below 200.  LDL below 100 for people who are at risk, below 70 for people at very high risk.  HDL above 40 is good. A level of 60 or higher is considered to be protective against heart disease.  Triglycerides below 150. How can I lower my cholesterol? Diet  Follow your diet program as told by your health care provider.  Choose fish or white meat chicken and [place], roasted or baked. Limit fatty cuts of red meat, fried foods, and processed meats, such as sausage and lunch meats.  Eat lots of fresh fruits and vegetables.  Choose whole grains, beans, pasta, potatoes, and cereals.  Choose olive oil, corn oil, or canola oil, and use only small amounts.  Avoid butter, mayonnaise, shortening, or palm kernel oils.  Avoid foods with trans fats.  Drink skim or nonfat milk and eat low-fat or nonfat yogurt and cheeses. Avoid whole milk, cream, ice cream, egg yolks, and full-fat cheeses.  Healthier desserts include angel food cake, ginger snaps, animal crackers, hard candy, popsicles, and low-fat or nonfat frozen yogurt. Avoid pastries, cakes, pies, and cookies. Exercise  Follow your exercise program as told by your health care provider. A regular program:  Helps to decrease LDL and raise HDL.  Helps with  weight control.  Do things that increase your activity level, such as gardening, walking, and taking the stairs.  Ask your health care provider about ways that you can be more active in your daily life. Medicine  Take over-the-counter and prescription medicines only as told by your health care provider.  Medicine may be prescribed by your health care provider to help lower cholesterol and decrease the risk for heart disease. This is usually done  if diet and exercise have failed to bring down cholesterol levels.  If you have several risk factors, you may need medicine even if your levels are normal. This information is not intended to replace advice given to you by your health care provider. Make sure you discuss any questions you have with your health care provider. Document Released: 07/20/2001 Document Revised: 05/22/2016 Document Reviewed: 04/24/2016 Elsevier Interactive Patient Education  2017 [employer].  Hypertension Hypertension, commonly called high blood pressure, is when the force of blood pumping through the arteries is too strong. The arteries are the blood vessels that carry blood from the heart throughout the body. Hypertension forces the heart to work harder to pump blood and may cause arteries to become narrow or stiff. Having untreated or uncontrolled hypertension can cause heart attacks, strokes, kidney disease, and other problems. A blood pressure reading consists of a higher number over a lower number. Ideally, your blood pressure should be below 120/80. The first ("top") number is called the systolic pressure. It is a measure of the pressure in your arteries as your heart beats. The second ("bottom") number is called the diastolic pressure. It is a measure of the pressure in your arteries as the heart relaxes. What are the causes? The cause of this condition is not known. What increases the risk? Some risk factors for high blood pressure are under your control. Others are not. Factors you can change   Smoking.  Having type 2 diabetes mellitus, high cholesterol, or both.  Not getting enough exercise or physical activity.  Being overweight.  Having too much fat, sugar, calories, or salt (sodium) in your diet.  Drinking too much alcohol. Factors that are difficult or impossible to change   Having chronic kidney disease.  Having a family history of high blood pressure.  Age. Risk increases with  age.  Race. You may be at higher risk if you are African-American.  Gender. Men are at higher risk than women before age 76. After age 90, women are at higher risk than men.  Having obstructive sleep apnea.  Stress. What are the signs or symptoms? Extremely high blood pressure (hypertensive crisis) may cause:  Headache.  Anxiety.  Shortness of breath.  Nosebleed.  Nausea and vomiting.  Severe chest pain.  Jerky movements you cannot control (seizures). How is this diagnosed? This condition is diagnosed by measuring your blood pressure while you are seated, with your arm resting on a surface. The cuff of the blood pressure monitor will be placed directly against the skin of your upper arm at the level of your heart. It should be measured at least twice using the same arm. Certain conditions can cause a difference in blood pressure between your right and left arms. Certain factors can cause blood pressure readings to be lower or higher than normal (elevated) for a short period of time:  When your blood pressure is higher when you are in a health care provider's office than when you are at home, this is called white coat hypertension. Most people with  this condition do not need medicines.  When your blood pressure is higher at home than when you are in a health care provider's office, this is called masked hypertension. Most people with this condition may need medicines to control blood pressure. If you have a high blood pressure reading during one visit or you have normal blood pressure with other risk factors:  You may be asked to return on a different day to have your blood pressure checked again.  You may be asked to monitor your blood pressure at home for 1 week or longer. If you are diagnosed with hypertension, you may have other blood or imaging tests to help your health care provider understand your overall risk for other conditions. How is this treated? This condition is  treated by making healthy lifestyle changes, such as eating healthy foods, exercising more, and reducing your alcohol intake. Your health care provider may prescribe medicine if lifestyle changes are not enough to get your blood pressure under control, and if:  Your systolic blood pressure is above 130.  Your diastolic blood pressure is above 80. Your personal target blood pressure may vary depending on your medical conditions, your age, and other factors. Follow these instructions at home: Eating and drinking   Eat a diet that is high in fiber and potassium, and low in sodium, added sugar, and fat. An example eating plan is called the DASH (Dietary Approaches to Stop Hypertension) diet. To eat this way:  Eat plenty of fresh fruits and vegetables. Try to fill half of your plate at each meal with fruits and vegetables.  Eat whole grains, such as whole wheat pasta, brown rice, or whole grain bread. Fill about one quarter of your plate with whole grains.  Eat or drink low-fat dairy products, such as skim milk or low-fat yogurt.  Avoid fatty cuts of meat, processed or cured meats, and poultry with skin. Fill about one quarter of your plate with lean proteins, such as fish, chicken without skin, beans, eggs, and tofu.  Avoid premade and processed foods. These tend to be higher in sodium, added sugar, and fat.  Reduce your daily sodium intake. Most people with hypertension should eat less than 1,500 mg of sodium a day.  Limit alcohol intake to no more than 1 drink a day for nonpregnant women and 2 drinks a day for men. One drink equals 12 oz of beer, 5 oz of wine, or 1 oz of hard liquor. Lifestyle   Work with your health care provider to maintain a healthy body weight or to lose weight. Ask what an ideal weight is for you.  Get at least 30 minutes of exercise that causes your heart to beat faster (aerobic exercise) most days of the week. Activities may include walking, swimming, or  biking.  Include exercise to strengthen your muscles (resistance exercise), such as pilates or lifting weights, as part of your weekly exercise routine. Try to do these types of exercises for 30 minutes at least 3 days a week.  Do not use any products that contain nicotine or tobacco, such as cigarettes and e-cigarettes. If you need help quitting, ask your health care provider.  Monitor your blood pressure at home as told by your health care provider.  Keep all follow-up visits as told by your health care provider. This is important. Medicines   Take over-the-counter and prescription medicines only as told by your health care provider. Follow directions carefully. Blood pressure medicines must be taken as prescribed.  Do not skip doses of blood pressure medicine. Doing this puts you at risk for problems and can make the medicine less effective.  Ask your health care provider about side effects or reactions to medicines that you should watch for. Contact a health care provider if:  You think you are having a reaction to a medicine you are taking.  You have headaches that keep coming back (recurring).  You feel dizzy.  You have swelling in your ankles.  You have trouble with your vision. Get help right away if:  You develop a severe headache or confusion.  You have unusual weakness or numbness.  You feel faint.  You have severe pain in your chest or abdomen.  You vomit repeatedly.  You have trouble breathing. Summary  Hypertension is when the force of blood pumping through your arteries is too strong. If this condition is not controlled, it may put you at risk for serious complications.  Your personal target blood pressure may vary depending on your medical conditions, your age, and other factors. For most people, a normal blood pressure is less than 120/80.  Hypertension is treated with lifestyle changes, medicines, or a combination of both. Lifestyle changes include  weight loss, eating a healthy, low-sodium diet, exercising more, and limiting alcohol. This information is not intended to replace advice given to you by your health care provider. Make sure you discuss any questions you have with your health care provider. Document Released: 10/25/2005 Document Revised: 09/22/2016 Document Reviewed: 09/22/2016 Elsevier Interactive Patient Education  2017 [employer].

## 2017-02-04 ENCOUNTER — Encounter: Payer: Self-pay | Admitting: Family Medicine

## 2017-02-23 ENCOUNTER — Telehealth: Payer: Self-pay | Admitting: *Deleted

## 2017-02-23 ENCOUNTER — Other Ambulatory Visit: Payer: BLUE CROSS/BLUE SHIELD

## 2017-02-23 DIAGNOSIS — Z5181 Encounter for therapeutic drug level monitoring: Secondary | ICD-10-CM

## 2017-02-23 LAB — BASIC METABOLIC PANEL
BUN: 14 mg/dL (ref 7–25)
CHLORIDE: 101 mmol/L (ref 98–110)
CO2: 29 mmol/L (ref 20–31)
Calcium: 9.8 mg/dL (ref 8.6–10.4)
Creat: 0.73 mg/dL (ref 0.50–1.05)
GLUCOSE: 101 mg/dL — AB (ref 65–99)
POTASSIUM: 4.1 mmol/L (ref 3.5–5.3)
Sodium: 140 mmol/L (ref 135–146)

## 2017-02-23 NOTE — Telephone Encounter (Signed)
Patient came in today for labs and bp check. I got 138/88 and her machine read 146/94. She also brought in a list so I made a copy and put in your folder.

## 2017-02-24 NOTE — Telephone Encounter (Signed)
BP's at 1/2 tablet were 122-145/76-89. After increased to full tablet on 4/9, BP's ranging 117-137/70-81 (mostly 120/75).

## 2017-04-21 ENCOUNTER — Other Ambulatory Visit: Payer: BLUE CROSS/BLUE SHIELD

## 2017-04-21 DIAGNOSIS — E7800 Pure hypercholesterolemia, unspecified: Secondary | ICD-10-CM

## 2017-04-21 DIAGNOSIS — Z5181 Encounter for therapeutic drug level monitoring: Secondary | ICD-10-CM

## 2017-04-21 LAB — BASIC METABOLIC PANEL
BUN: 12 mg/dL (ref 7–25)
CALCIUM: 9.8 mg/dL (ref 8.6–10.4)
CHLORIDE: 101 mmol/L (ref 98–110)
CO2: 28 mmol/L (ref 20–31)
CREATININE: 0.84 mg/dL (ref 0.50–1.05)
Glucose, Bld: 103 mg/dL — ABNORMAL HIGH (ref 65–99)
Potassium: 3.8 mmol/L (ref 3.5–5.3)
Sodium: 140 mmol/L (ref 135–146)

## 2017-04-21 LAB — LIPID PANEL
CHOL/HDL RATIO: 4.4 ratio (ref <5.0)
CHOLESTEROL: 233 mg/dL — AB (ref <200)
HDL: 53 mg/dL (ref >50)
LDL Cholesterol: 158 mg/dL — ABNORMAL HIGH (ref <100)
TRIGLYCERIDES: 112 mg/dL (ref <150)
VLDL: 22 mg/dL (ref <30)

## 2017-04-24 NOTE — Progress Notes (Signed)
Chief Complaint  Patient presents with  . Medication Management    no concerns. non- fasting    Patient presents to follow up on hypertension and hyperlipidemia.  Hypertension:  She was started on HCTZ after her last visit the end of March. She started at 1/2 tablet, then increased to a full tablet. BP's at home on the 1/2 tablet were 122-145/76-89. After increased to full tablet on 4/9, BP's ranging 117-137/70-81 (mostly 120/75).  Her nurse visit to check accuracy of her monitor showed nurse's BP reading 138/88 and her machine read 146/94 (within 10 points). Cycling through the memory on her machine today (she didn't bring in paper list)--highest was this morning (147 systolic), mostly 115/136/70-80's.  She denies headaches, dizziness, chest pain, palpitations, muscle cramps or other side effects of medication.  Hyperlipidemia:  She was given information on low cholesterol diet at last visit, was asked to cut back on her red meat to 1-2x/week.  She is eating more fruits, vegetables (fruit smoothies daily, no dairy in it).  Cut back on red meat, maybe once a week or less. Denies creamy sauces/dressings.  She has some ice cream, not daily. 2% milk in her cereal. Uses Hazelnut coffee creamer.  Eats 1-2 eggs/week  She had labs done prior to her visit, see below.  PMH, PSH, SH reviewed  Outpatient Encounter Prescriptions as of 04/25/2017  Medication Sig Note  . cholecalciferol (VITAMIN D) 1000 UNITS tablet Take 1,000 Units by mouth daily. 09/08/2015: Takes it 3-4x/week  . cyanocobalamin 500 MCG tablet Take 500 mcg by mouth daily.   . hydrochlorothiazide (HYDRODIURIL) 25 MG tablet Take 1 tablet (25 mg total) by mouth daily.   . Multiple Vitamins-Minerals (MULTIVITAMIN WITH MINERALS) tablet Take 1 tablet by mouth daily. 09/08/2015: Taking a powdered form of a MVI.  . [DISCONTINUED] hydrochlorothiazide (HYDRODIURIL) 25 MG tablet Take 1 tablet (25 mg total) by mouth daily.     Facility-Administered Encounter Medications as of 04/25/2017  Medication  . 0.9 %  sodium chloride infusion   No Known Allergies   ROS: no fever, chills, URI symptoms, allergies (resolved/improved), headaches, dizziness, chest pain, shortness of breath, edema.  No GI or GU complaints. Moods are good Lost 5# since last visit.  PHYSICAL EXAM:  BP 120/70   Pulse 71   Resp 18   Ht 5\' 5"  (1.651 m)   Wt 146 lb 12.8 oz (66.6 kg)   LMP 08/08/2015 (Approximate)   SpO2 93%   BMI 24.43 kg/m   Wt Readings from Last 3 Encounters:  04/25/17 146 lb 12.8 oz (66.6 kg)  02/03/17 152 lb (68.9 kg)  01/03/17 153 lb 6.4 oz (69.6 kg)   Well appearing, pleasant female in no distress HEENT: conjunctiva and sclera are clear EOMI, OP clear Neck: no lymphadenopathy, thyromegaly or mass, no bruit Heart: regular rate and rhythm, no murmur Lungs: clear bilaterally Abdomen: soft, nontender, no mass or bruit Extremities: no edema, 2+ pulses Neuro: alert and oriented, normal strength, cranial nerves, gait Psych: normal mood, affect, hygiene and grooming     Chemistry      Component Value Date/Time   NA 140 04/21/2017 0740   K 3.8 04/21/2017 0740   CL 101 04/21/2017 0740   CO2 28 04/21/2017 0740   BUN 12 04/21/2017 0740   CREATININE 0.84 04/21/2017 0740      Component Value Date/Time   CALCIUM 9.8 04/21/2017 0740   ALKPHOS 84 01/31/2017 0830   AST 15 01/31/2017 0830   ALT 11  01/31/2017 0830   BILITOT 1.3 (H) 01/31/2017 0830     Fasting glucose 103  Lab Results  Component Value Date   CHOL 233 (H) 04/21/2017   HDL 53 04/21/2017   LDLCALC 158 (H) 04/21/2017   TRIG 112 04/21/2017   CHOLHDL 4.4 04/21/2017    ASSESSMENT/PLAN:  Essential hypertension - controlled with HCTZ, continue - Plan: Amb ref to Medical Nutrition Therapy-MNT, hydrochlorothiazide (HYDRODIURIL) 25 MG tablet  Hypercholesterolemia - low cholesterol diet reviewed--diet improved, but lipid panel unchanged.  Further  diet, recheck at CPE, meds if still elevated - Plan: Amb ref to Medical Nutrition Therapy-MNT  Impaired fasting glucose - counseled re: diet, exercise; recheck at CPE with A1c - Plan: Amb ref to Medical Nutrition Therapy-MNT  Counseled re: salt, lowfat, low cholesterol diet, IFG, regular exercise. 25 min visit, more than 1/2 spent counseling.  Refer to nutritionist for high cholesterol, prediabetes (new dx) and hypertension. Plan to recheck lipids in 3-6 months and if the same, likely will need meds started. Goal LDL <130. Okay to wait and have labs done prior to her physical in 6-7 mos--to call sooner if prefers to recheck lipids sooner.

## 2017-04-25 ENCOUNTER — Encounter: Payer: Self-pay | Admitting: Family Medicine

## 2017-04-25 ENCOUNTER — Ambulatory Visit (INDEPENDENT_AMBULATORY_CARE_PROVIDER_SITE_OTHER): Payer: BLUE CROSS/BLUE SHIELD | Admitting: Family Medicine

## 2017-04-25 VITALS — BP 120/70 | HR 71 | Resp 18 | Ht 65.0 in | Wt 146.8 lb

## 2017-04-25 DIAGNOSIS — Z5181 Encounter for therapeutic drug level monitoring: Secondary | ICD-10-CM

## 2017-04-25 DIAGNOSIS — Z0000 Encounter for general adult medical examination without abnormal findings: Secondary | ICD-10-CM

## 2017-04-25 DIAGNOSIS — R7301 Impaired fasting glucose: Secondary | ICD-10-CM

## 2017-04-25 DIAGNOSIS — E559 Vitamin D deficiency, unspecified: Secondary | ICD-10-CM

## 2017-04-25 DIAGNOSIS — E7800 Pure hypercholesterolemia, unspecified: Secondary | ICD-10-CM

## 2017-04-25 DIAGNOSIS — I10 Essential (primary) hypertension: Secondary | ICD-10-CM

## 2017-04-25 MED ORDER — HYDROCHLOROTHIAZIDE 25 MG PO TABS: 25.0000 mg | ORAL_TABLET | Freq: Every day | ORAL | 1 refills | Status: DC

## 2017-04-25 NOTE — Patient Instructions (Addendum)
Continue the low sodium diet, and low cholesterol diet. Keep a food journal for about 5-7 days prior to meeting with the nutritionist and bring to the visit.  Your blood pressure seems well controlled.  Return sooner than your physical if it is consistently running >140/90.  Try and limit the sugar in your diet as well (sweets, juices, regular sodas).  And limit the portions of carbs (white foods such as breads, rice, pasta, etc) as these can raise sugars.  Continue your current medication.  I recommend getting the new shingles vaccine (Shingrix). You will need to check with your insurance to see if it is covered.  It is a series of 2 injections, spaced 2 months apart.

## 2017-10-25 ENCOUNTER — Other Ambulatory Visit: Payer: Self-pay | Admitting: Family Medicine

## 2017-10-25 DIAGNOSIS — I10 Essential (primary) hypertension: Secondary | ICD-10-CM

## 2017-10-25 MED ORDER — HYDROCHLOROTHIAZIDE 25 MG PO TABS: 25.0000 mg | ORAL_TABLET | Freq: Every day | ORAL | 0 refills | Status: DC

## 2017-11-21 ENCOUNTER — Other Ambulatory Visit: Payer: BLUE CROSS/BLUE SHIELD

## 2017-11-21 NOTE — Addendum Note (Signed)
Addended by: Jamiyah Dingley N on: 11/21/2017 09:38 AM   Modules accepted: Orders  

## 2017-11-21 NOTE — Addendum Note (Signed)
Addended by: Handy Mcloud N on: 11/21/2017 09:38 AM   Modules accepted: Orders  

## 2017-11-21 NOTE — Addendum Note (Signed)
Addended by: Winn JockVALENTINE, Jayceion Lisenby N on: 11/21/2017 09:38 AM   Modules accepted: Orders

## 2017-11-21 NOTE — Addendum Note (Signed)
Addended by: Reilley Latorre N on: 11/21/2017 09:38 AM   Modules accepted: Orders  

## 2017-11-21 NOTE — Addendum Note (Signed)
Addended by: Theone Bowell N on: 11/21/2017 09:38 AM   Modules accepted: Orders  

## 2017-11-21 NOTE — Addendum Note (Signed)
Addended by: Winn JockVALENTINE, Wrenly Lauritsen N on: 11/21/2017 09:37 AM   Modules accepted: Orders

## 2017-11-22 ENCOUNTER — Other Ambulatory Visit: Payer: BLUE CROSS/BLUE SHIELD

## 2017-11-22 DIAGNOSIS — I10 Essential (primary) hypertension: Secondary | ICD-10-CM

## 2017-11-22 DIAGNOSIS — R7301 Impaired fasting glucose: Secondary | ICD-10-CM

## 2017-11-22 DIAGNOSIS — E559 Vitamin D deficiency, unspecified: Secondary | ICD-10-CM

## 2017-11-22 DIAGNOSIS — Z0000 Encounter for general adult medical examination without abnormal findings: Secondary | ICD-10-CM

## 2017-11-22 DIAGNOSIS — E7800 Pure hypercholesterolemia, unspecified: Secondary | ICD-10-CM

## 2017-11-22 DIAGNOSIS — Z5181 Encounter for therapeutic drug level monitoring: Secondary | ICD-10-CM

## 2017-11-23 LAB — COMPREHENSIVE METABOLIC PANEL
ALT: 13 IU/L (ref 0–32)
AST: 21 IU/L (ref 0–40)
Albumin/Globulin Ratio: 1.7 (ref 1.2–2.2)
Albumin: 4.7 g/dL (ref 3.5–5.5)
Alkaline Phosphatase: 87 IU/L (ref 39–117)
BUN/Creatinine Ratio: 18 (ref 9–23)
BUN: 13 mg/dL (ref 6–24)
Bilirubin Total: 0.9 mg/dL (ref 0.0–1.2)
CALCIUM: 9.4 mg/dL (ref 8.7–10.2)
CO2: 26 mmol/L (ref 20–29)
Chloride: 101 mmol/L (ref 96–106)
Creatinine, Ser: 0.73 mg/dL (ref 0.57–1.00)
GFR, EST AFRICAN AMERICAN: 106 mL/min/{1.73_m2} (ref >59)
GFR, EST NON AFRICAN AMERICAN: 92 mL/min/{1.73_m2} (ref >59)
Globulin, Total: 2.8 g/dL (ref 1.5–4.5)
Glucose: 107 mg/dL — ABNORMAL HIGH (ref 65–99)
Potassium: 4.1 mmol/L (ref 3.5–5.2)
Sodium: 142 mmol/L (ref 134–144)
TOTAL PROTEIN: 7.5 g/dL (ref 6.0–8.5)

## 2017-11-23 LAB — CBC WITH DIFFERENTIAL/PLATELET
BASOS: 0 %
Basophils Absolute: 0 10*3/uL (ref 0.0–0.2)
EOS (ABSOLUTE): 0.1 10*3/uL (ref 0.0–0.4)
Eos: 1 %
Hematocrit: 39.1 % (ref 34.0–46.6)
Hemoglobin: 12.6 g/dL (ref 11.1–15.9)
IMMATURE GRANS (ABS): 0 10*3/uL (ref 0.0–0.1)
Immature Granulocytes: 0 %
LYMPHS ABS: 1.8 10*3/uL (ref 0.7–3.1)
LYMPHS: 28 %
MCH: 27.9 pg (ref 26.6–33.0)
MCHC: 32.2 g/dL (ref 31.5–35.7)
MCV: 87 fL (ref 79–97)
MONOS ABS: 0.3 10*3/uL (ref 0.1–0.9)
Monocytes: 4 %
NEUTROS ABS: 4.2 10*3/uL (ref 1.4–7.0)
Neutrophils: 67 %
PLATELETS: 343 10*3/uL (ref 150–379)
RBC: 4.52 x10E6/uL (ref 3.77–5.28)
RDW: 15.8 % — ABNORMAL HIGH (ref 12.3–15.4)
WBC: 6.3 10*3/uL (ref 3.4–10.8)

## 2017-11-23 LAB — LIPID PANEL
CHOL/HDL RATIO: 3.6 ratio (ref 0.0–4.4)
Cholesterol, Total: 214 mg/dL — ABNORMAL HIGH (ref 100–199)
HDL: 60 mg/dL (ref >39)
LDL Calculated: 141 mg/dL — ABNORMAL HIGH (ref 0–99)
Triglycerides: 63 mg/dL (ref 0–149)
VLDL CHOLESTEROL CAL: 13 mg/dL (ref 5–40)

## 2017-11-23 LAB — HEMOGLOBIN A1C
Est. average glucose Bld gHb Est-mCnc: 114 mg/dL
Hgb A1c MFr Bld: 5.6 % (ref 4.8–5.6)

## 2017-11-23 LAB — VITAMIN D 25 HYDROXY (VIT D DEFICIENCY, FRACTURES): Vit D, 25-Hydroxy: 33.3 ng/mL (ref 30.0–100.0)

## 2017-11-23 LAB — TSH: TSH: 1.85 u[IU]/mL (ref 0.450–4.500)

## 2017-11-23 NOTE — Progress Notes (Deleted)
Stephanie Wu is a 57 y.o. female who presents for a complete physical.  She has the following concerns:  She was referred to dietician after her last visit in June, for help with cholesterol, impaired fasting glucose and high cholesterol. She declined to schedule the appointment when they contacted her.  She had repeat labs done prior to today's visit.  Hypertension:  She was started on HCTZ in 01/2017.  Her nurse visit to check accuracy of her monitor showed nurse's BP reading 138/88 and her machine read 146/94 (within 10 points).  She denies headaches, dizziness, chest pain, palpitations, muscle cramps or other side effects of medication.  Hyperlipidemia:   She is eating more fruits, vegetables (fruit smoothies daily, no dairy in it).  Cut back on red meat, maybe once a week or less. Denies creamy sauces/dressings.  She has some ice cream, not daily. 2% milk in her cereal. Uses Hazelnut coffee creamer.  Eats 1-2 eggs/week  She had been noted to have mildly elevated fasting glucoses   Immunization History  Administered Date(s) Administered  . Influenza,inj,Quad PF,6+ Mos 08/03/2013, 10/06/2015, 01/03/2017  . PPD Test 03/12/2013  . Tdap 04/27/2007   Last Pap smear: 07/2013.  Denies h/o abnormal paps.  Last mammogram: 12/2016 Last colonoscopy: 04/28/12, due again in 2023 Last DEXA: never  Dentist: twice yearly  Ophtho:  Exercise: treadmill 2x/week (15 min twice daily), and small bicycle machine 2x/week.  Some skipping rope.  Only using handweights 1-2x/week.     ROS: The patient denies anorexia, fever, headaches, vision changes, decreased hearing, ear pain, sore throat, breast concerns, chest pain, dizziness, syncope, dyspnea on exertion, cough, swelling, nausea, vomiting, diarrhea, constipation, abdominal pain, melena, hematochezia, indigestion/heartburn, hematuria, incontinence, dysuria, vaginal bleeding,  discharge, odor or itch, genital lesions, joint pains, numbness,  weakness, tremor, suspicious skin lesions, depression, anxiety, abnormal bleeding/bruising, or enlarged lymph nodes. Sometimes right before bedtime she feels her heart racing some. Still has some hot flashes, no night sweats. Last period was 07/2014. Tingling in her R 3rd and 4th fingers.  Arm rests on the edge of her desk when using the mouse, and she think this is what is causing it--plans to move mouse. UPDATE  PHYSICAL EXAM:  Wt Readings from Last 3 Encounters:  04/25/17 146 lb 12.8 oz (66.6 kg)  02/03/17 152 lb (68.9 kg)  01/03/17 153 lb 6.4 oz (69.6 kg)     General Appearance:  Alert, cooperative, no distress, appears stated age   Head:  Normocephalic, without obvious abnormality, atraumatic   Eyes:  PERRL, conjunctiva/corneas clear, EOM's intact, fundi benign   Ears:  Normal TM's and external ear canals   Nose:  Nares normal, mucosa normal, no drainage or sinus tenderness   Throat:  Lips, mucosa, and tongue normal; teeth and gums normal.   Neck:  Supple, no lymphadenopathy; thyroid: no enlargement/tenderness/ nodules; no carotid bruit or JVD   Back:  Spine nontender, no curvature, ROM normal, no CVA tenderness   Lungs:  Clear to auscultation bilaterally without wheezes, rales or ronchi; respirations unlabored   Chest Wall:  No tenderness or deformity   Heart:  Regular rate and rhythm, S1 and S2 normal, no murmur, rub or gallop   Breast Exam:  No tenderness, masses, or nipple discharge or inversion. No axillary lymphadenopathy   Abdomen:  Soft, non-tender, nondistended, normoactive bowel sounds, no masses, no hepatosplenomegaly   Genitalia:  Normal external genitalia without lesions. BUS and vagina normal; No cervical lesions or cervical motion tenderness. Uterus  and adnexa not enlarged, nontender, no masses. Pap performed   Rectal:  Normal tone, no masses or tenderness; guaiac negative stool   Extremities:  No clubbing, cyanosis or edema    Pulses:  2+ and symmetric all extremities   Skin:  Skin color, texture, turgor normal, no rashes or lesions.   Lymph nodes:  Cervical, supraclavicular, and axillary nodes normal   Neurologic:  CNII-XII intact, normal strength, sensation and gait; reflexes 2+ and symmetric throughout    Psych: Normal mood, affect, hygiene and grooming   Lab Results  Component Value Date   WBC 6.3 11/22/2017   HGB 12.6 11/22/2017   HCT 39.1 11/22/2017   MCV 87 11/22/2017   PLT 343 11/22/2017   Lab Results  Component Value Date   TSH 1.850 11/22/2017   Lab Results  Component Value Date   CHOL 214 (H) 11/22/2017   HDL 60 11/22/2017   LDLCALC 141 (H) 11/22/2017   TRIG 63 11/22/2017   CHOLHDL 3.6 11/22/2017     Chemistry      Component Value Date/Time   NA 142 11/22/2017 0937   K 4.1 11/22/2017 0937   CL 101 11/22/2017 0937   CO2 26 11/22/2017 0937   BUN 13 11/22/2017 0937   CREATININE 0.73 11/22/2017 0937   CREATININE 0.84 04/21/2017 0740      Component Value Date/Time   CALCIUM 9.4 11/22/2017 0937   ALKPHOS 87 11/22/2017 0937   AST 21 11/22/2017 0937   ALT 13 11/22/2017 0937   BILITOT 0.9 11/22/2017 0937     Fasting glucose 107  Vitamin D-OH 33.3  Lab Results  Component Value Date   HGBA1C 5.6 11/22/2017    ASSESSMENT/PLAN:  Pap smear Td Flu shot Shingrix rec   Discussed monthly self breast exams and yearly mammograms; at least 30 minutes of aerobic activity at least 5 days/week, weight-bearing exercise 2x/week; proper sunscreen use reviewed; healthy diet, including goals of calcium and vitamin D intake and alcohol recommendations (less than or equal to 1 drink/day) reviewed; regular seatbelt use; changing batteries in smoke detectors. Immunization recommendations discussed--yearly flu shots recommended. Colonoscopy recommendations reviewed--UTD, due in 2023

## 2017-11-24 ENCOUNTER — Encounter: Payer: BLUE CROSS/BLUE SHIELD | Admitting: Family Medicine

## 2018-01-05 ENCOUNTER — Encounter: Payer: Self-pay | Admitting: *Deleted

## 2018-01-05 ENCOUNTER — Telehealth: Payer: Self-pay | Admitting: *Deleted

## 2018-01-05 NOTE — Telephone Encounter (Signed)
error 

## 2018-02-18 ENCOUNTER — Other Ambulatory Visit: Payer: Self-pay | Admitting: Family Medicine

## 2018-02-18 DIAGNOSIS — I10 Essential (primary) hypertension: Secondary | ICD-10-CM

## 2018-02-20 ENCOUNTER — Other Ambulatory Visit: Payer: Self-pay | Admitting: *Deleted

## 2018-02-20 DIAGNOSIS — I10 Essential (primary) hypertension: Secondary | ICD-10-CM

## 2018-02-20 MED ORDER — HYDROCHLOROTHIAZIDE 25 MG PO TABS: 25.0000 mg | ORAL_TABLET | Freq: Every day | ORAL | 0 refills | Status: DC

## 2018-02-20 NOTE — Telephone Encounter (Signed)
Left message for patient to call me back to schedule med check so I can refill.

## 2018-02-20 NOTE — Telephone Encounter (Signed)
She cancelled her CPE and never r/s'd-should I call and schedule a med check?

## 2018-02-20 NOTE — Telephone Encounter (Signed)
Refill #30 only, needs med check scheduled, since she never r/s her physical

## 2018-02-21 ENCOUNTER — Other Ambulatory Visit: Payer: Self-pay | Admitting: Family Medicine

## 2018-02-21 DIAGNOSIS — Z139 Encounter for screening, unspecified: Secondary | ICD-10-CM

## 2018-03-14 ENCOUNTER — Ambulatory Visit
Admission: RE | Admit: 2018-03-14 | Discharge: 2018-03-14 | Disposition: A | Discharge status: Home / Self Care | Payer: BLUE CROSS/BLUE SHIELD | Source: Ambulatory Visit | Attending: Family Medicine | Admitting: Family Medicine

## 2018-03-14 DIAGNOSIS — Z139 Encounter for screening, unspecified: Secondary | ICD-10-CM

## 2018-03-15 ENCOUNTER — Other Ambulatory Visit: Payer: Self-pay | Admitting: Family Medicine

## 2018-03-15 DIAGNOSIS — I10 Essential (primary) hypertension: Secondary | ICD-10-CM

## 2018-03-26 NOTE — Progress Notes (Signed)
Chief Complaint  Patient presents with  . Med check    Med Check     Patient presents to follow up on hypertension and hyperlipidemia.  She had labs done in January, which were prior to her scheduled physical, but she had to cancel her physical. Her last visit here was 04/2017.  Hypertension:  She is compliant with taking HCTZ  daily.  BP's at home have been running 119-130/70-82 (higher at 138-141/83-87 the last 2 days only). Allergies have been flaring the last couple of days, not feeling great, which may have contributed to why her BP was higher this week than the rest of the month.   She denies headaches, dizziness, chest pain, palpitations, muscle cramps or other side effects of medication.  Hyperlipidemia:  She continues to try and follow lowfat, low cholesterol diet. She continues to eat more fruits, vegetables (fruit smoothies daily, no dairy in it).  Cut back on red meat, maybe once a week or less. Denies creamy sauces/dressings.  She now eats fruit bars rather than ice cream.  2% milk in her cereal. Uses Hazelnut coffee creamer, but only infrequent coffee (not daily). Eats 1-2 eggs/week or less. Eats a lot of fish, chicken, salads (no cheese).  Stephanie Wu, walks outside, some bike riding, has a stepper at home.  Exercises at least 3-4 days/week.  H/o Vitamin D deficiency.  Last time level was low was in 08/2015, with level of 24. Level was normal in 2018 and 2019 (see below), taking 1000 IU Vitamin D3 daily. She had labs done prior to her visit, see below.  Impaired fasting glucose:  Fasting sugar was 107 on January's labs, A1c was normal at 5.6.   She limits sweets, see diet above.   PMH, PSH, SH reviewed  Outpatient Encounter Medications as of 03/29/2018  Medication Sig Note  . cholecalciferol (VITAMIN D) 1000 UNITS tablet Take 1,000 Units by mouth daily. 09/08/2015: Takes it 3-4x/week  . hydrochlorothiazide (HYDRODIURIL) 25 MG tablet Take 1 tablet (25 mg total) by mouth  daily.   . Multiple Vitamins-Minerals (MULTIVITAMIN WITH MINERALS) tablet Take 1 tablet by mouth daily. 09/08/2015: Taking a powdered form of a MVI.  . [DISCONTINUED] hydrochlorothiazide (HYDRODIURIL) 25 MG tablet TAKE 1 TABLET BY MOUTH EVERY DAY   . cyanocobalamin 500 MCG tablet Take 500 mcg by mouth daily.    Facility-Administered Encounter Medications as of 03/29/2018  Medication  . 0.9 %  sodium chloride infusion   No Known Allergies   ROS: no fever, chills, URI symptoms, headaches, dizziness, chest pain, shortness of breath, edema. No GI or GU complaints. Moods are good. Some seasonal allergies, recently flaring   PHYSICAL EXAM:  BP 124/78   Pulse 72   Ht  (1.651 m)   Wt 149 lb 9.6 oz (67.9 kg)   LMP 08/08/2015 (Approximate)   BMI 24.89 kg/m    Wt Readings from Last 3 Encounters:  04/25/17 146 lb 12.8 oz (66.6 kg)  02/03/17 152 lb (68.9 kg)  01/03/17 153 lb 6.4 oz (69.6 kg)   Well appearing, pleasant female in no distress HEENT: conjunctiva and sclera are clear EOMI, OP clear Neck: no lymphadenopathy, thyromegaly or mass, no bruit Heart: regular rate and rhythm, no murmur Lungs: clear bilaterally Abdomen: soft, nontender, no mass Extremities: no edema, 2+ pulses Neuro: alert and oriented, normal strength, cranial nerves, gait Psych: normal mood, affect, hygiene and grooming     Chemistry      Component Value Date/Time   NA 142  11/22/2017 0937   K 4.1 11/22/2017 0937   CL 101 11/22/2017 0937   CO2 26 11/22/2017 0937   BUN 13 11/22/2017 0937   CREATININE 0.73 11/22/2017 0937   CREATININE 0.84 04/21/2017 0740      Component Value Date/Time   CALCIUM 9.4 11/22/2017 0937   ALKPHOS 87 11/22/2017 0937   AST 21 11/22/2017 0937   ALT 13 11/22/2017 0937   BILITOT 0.9 11/22/2017 0937     Fasting glucose 107  Lab Results  Component Value Date   CHOL 214 (H) 11/22/2017   HDL 60 11/22/2017   LDLCALC 141 (H) 11/22/2017   TRIG 63 11/22/2017   CHOLHDL  3.6 11/22/2017   Vitamin D-OH 33.3  Lab Results  Component Value Date   TSH 1.850 11/22/2017   Lab Results  Component Value Date   HGBA1C 5.6 11/22/2017    ASSESSMENT/PLAN:  Essential hypertension - controlled with HCTZ, continue - Plan: hydrochlorothiazide (HYDRODIURIL) 25 MG tablet  Impaired fasting glucose - counseled re: diet, exercise. Suspect it will be improved (healthier diet, exercise, weight); recheck at CPE  Hypercholesterolemia - diet controlled; reviewed lowfat, low cholesterol diet, goals, risks    To reschedule her CPE. To be fasting for CPE, repeat labs at visit.   Discussed risks/benefits/side effects of Shingrix, encouraged to check insurance and get, if covered

## 2018-03-29 ENCOUNTER — Ambulatory Visit (INDEPENDENT_AMBULATORY_CARE_PROVIDER_SITE_OTHER): Payer: BLUE CROSS/BLUE SHIELD | Admitting: Family Medicine

## 2018-03-29 ENCOUNTER — Encounter: Payer: Self-pay | Admitting: Family Medicine

## 2018-03-29 VITALS — BP 124/78 | HR 72 | Ht 65.0 in | Wt 149.6 lb

## 2018-03-29 DIAGNOSIS — E7800 Pure hypercholesterolemia, unspecified: Secondary | ICD-10-CM

## 2018-03-29 DIAGNOSIS — R7301 Impaired fasting glucose: Secondary | ICD-10-CM | POA: Diagnosis not present

## 2018-03-29 DIAGNOSIS — I10 Essential (primary) hypertension: Secondary | ICD-10-CM | POA: Diagnosis not present

## 2018-03-29 MED ORDER — HYDROCHLOROTHIAZIDE 25 MG PO TABS: 25.0000 mg | ORAL_TABLET | Freq: Every day | ORAL | 1 refills | Status: DC

## 2018-03-29 NOTE — Patient Instructions (Addendum)
I recommend getting the new shingles vaccine (Shingrix). You will need to check with your insurance to see if it is covered, and if covered by Medicare Part D, you need to get from the pharmacy rather than our office.  It is a series of 2 injections, spaced 2 months apart.  If covered, call to schedule a nurse visit (if we don't have it available, we should put your name on a list to be called when it is available).  Continue your current medications, low sodium, lowfat and low cholesterol diet as you have been. Continue to get regular exercise, maintain healthy weight, and limit the sweets and carbs in your diet.  We should recheck your cholesterol, sugar, etc when you return for your physical (come fasting for your visit).

## 2018-11-16 ENCOUNTER — Encounter: Payer: Self-pay | Admitting: Family Medicine

## 2021-12-18 DIAGNOSIS — I10 Essential (primary) hypertension: Secondary | ICD-10-CM | POA: Diagnosis not present

## 2021-12-18 DIAGNOSIS — R7989 Other specified abnormal findings of blood chemistry: Secondary | ICD-10-CM | POA: Diagnosis not present

## 2021-12-18 DIAGNOSIS — R002 Palpitations: Secondary | ICD-10-CM | POA: Diagnosis not present

## 2021-12-21 DIAGNOSIS — R002 Palpitations: Secondary | ICD-10-CM | POA: Diagnosis not present

## 2021-12-21 DIAGNOSIS — R7989 Other specified abnormal findings of blood chemistry: Secondary | ICD-10-CM | POA: Diagnosis not present

## 2021-12-21 DIAGNOSIS — I10 Essential (primary) hypertension: Secondary | ICD-10-CM | POA: Diagnosis not present

## 2022-01-04 ENCOUNTER — Ambulatory Visit: Payer: Self-pay | Admitting: Cardiology

## 2022-01-15 DIAGNOSIS — I10 Essential (primary) hypertension: Secondary | ICD-10-CM | POA: Diagnosis not present

## 2022-01-15 DIAGNOSIS — Z13228 Encounter for screening for other metabolic disorders: Secondary | ICD-10-CM | POA: Diagnosis not present

## 2022-01-15 DIAGNOSIS — R17 Unspecified jaundice: Secondary | ICD-10-CM | POA: Diagnosis not present

## 2022-01-15 DIAGNOSIS — D531 Other megaloblastic anemias, not elsewhere classified: Secondary | ICD-10-CM | POA: Diagnosis not present

## 2022-01-15 DIAGNOSIS — R002 Palpitations: Secondary | ICD-10-CM | POA: Diagnosis not present

## 2022-01-19 ENCOUNTER — Telehealth: Payer: Self-pay | Admitting: Hematology and Oncology

## 2022-01-19 NOTE — Telephone Encounter (Signed)
Scheduled appt per 3/14 referral. Pt is aware of appt date and time. Pt is aware to arrive 15 mins prior to appt time and to bring and updated insurance card. Pt is aware of appt location.   ?

## 2022-02-01 ENCOUNTER — Inpatient Hospital Stay: Payer: 59 | Attending: Hematology and Oncology

## 2022-02-01 ENCOUNTER — Other Ambulatory Visit: Payer: Self-pay

## 2022-02-01 ENCOUNTER — Inpatient Hospital Stay (HOSPITAL_BASED_OUTPATIENT_CLINIC_OR_DEPARTMENT_OTHER): Payer: 59 | Admitting: Hematology and Oncology

## 2022-02-01 VITALS — BP 137/89 | HR 66 | Temp 97.8°F | Resp 16 | Wt 134.8 lb

## 2022-02-01 DIAGNOSIS — D599 Acquired hemolytic anemia, unspecified: Secondary | ICD-10-CM | POA: Diagnosis not present

## 2022-02-01 DIAGNOSIS — Z807 Family history of other malignant neoplasms of lymphoid, hematopoietic and related tissues: Secondary | ICD-10-CM

## 2022-02-01 DIAGNOSIS — E785 Hyperlipidemia, unspecified: Secondary | ICD-10-CM | POA: Diagnosis not present

## 2022-02-01 LAB — DIRECT ANTIGLOBULIN TEST (NOT AT ARMC)
DAT, IgG: NEGATIVE
DAT, complement: NEGATIVE

## 2022-02-01 LAB — CMP (CANCER CENTER ONLY)
ALT: 249 U/L — ABNORMAL HIGH (ref 0–44)
AST: 67 U/L — ABNORMAL HIGH (ref 15–41)
Albumin: 4.5 g/dL (ref 3.5–5.0)
Alkaline Phosphatase: 121 U/L (ref 38–126)
Anion gap: 6 (ref 5–15)
BUN: 11 mg/dL (ref 6–20)
CO2: 29 mmol/L (ref 22–32)
Calcium: 9.3 mg/dL (ref 8.9–10.3)
Chloride: 109 mmol/L (ref 98–111)
Creatinine: 0.66 mg/dL (ref 0.44–1.00)
GFR, Estimated: >60 mL/min (ref >60)
Glucose, Bld: 95 mg/dL (ref 70–99)
Potassium: 4.1 mmol/L (ref 3.5–5.1)
Sodium: 144 mmol/L (ref 135–145)
Total Bilirubin: 2.8 mg/dL — ABNORMAL HIGH (ref 0.3–1.2)
Total Protein: 6.8 g/dL (ref 6.5–8.1)

## 2022-02-01 LAB — IRON AND IRON BINDING CAPACITY (CC-WL,HP ONLY)
Iron: 32 ug/dL (ref 28–170)
Saturation Ratios: 10 % — ABNORMAL LOW (ref 10.4–31.8)
TIBC: 321 ug/dL (ref 250–450)
UIBC: 289 ug/dL (ref 148–442)

## 2022-02-01 LAB — FERRITIN: Ferritin: 69 ng/mL (ref 11–307)

## 2022-02-01 LAB — CBC WITH DIFFERENTIAL (CANCER CENTER ONLY)
Abs Immature Granulocytes: 0.01 10*3/uL (ref 0.00–0.07)
Basophils Absolute: 0 10*3/uL (ref 0.0–0.1)
Basophils Relative: 0 %
Eosinophils Absolute: 0.1 10*3/uL (ref 0.0–0.5)
Eosinophils Relative: 1 %
HCT: 33.1 % — ABNORMAL LOW (ref 36.0–46.0)
Hemoglobin: 11.4 g/dL — ABNORMAL LOW (ref 12.0–15.0)
Immature Granulocytes: 0 %
Lymphocytes Relative: 25 %
Lymphs Abs: 1.4 10*3/uL (ref 0.7–4.0)
MCH: 39.6 pg — ABNORMAL HIGH (ref 26.0–34.0)
MCHC: 34.4 g/dL (ref 30.0–36.0)
MCV: 114.9 fL — ABNORMAL HIGH (ref 80.0–100.0)
Monocytes Absolute: 0.3 10*3/uL (ref 0.1–1.0)
Monocytes Relative: 5 %
Neutro Abs: 3.9 10*3/uL (ref 1.7–7.7)
Neutrophils Relative %: 69 %
Platelet Count: 328 10*3/uL (ref 150–400)
RBC: 2.88 MIL/uL — ABNORMAL LOW (ref 3.87–5.11)
RDW: 13.4 % (ref 11.5–15.5)
WBC Count: 5.7 10*3/uL (ref 4.0–10.5)
nRBC: 0 % (ref 0.0–0.2)

## 2022-02-01 LAB — VITAMIN B12: Vitamin B-12: 1018 pg/mL — ABNORMAL HIGH (ref 180–914)

## 2022-02-01 LAB — RETIC PANEL
Immature Retic Fract: 14.9 % (ref 2.3–15.9)
RBC.: 2.87 MIL/uL — ABNORMAL LOW (ref 3.87–5.11)
Retic Count, Absolute: 37.6 10*3/uL (ref 19.0–186.0)
Retic Ct Pct: 1.3 % (ref 0.4–3.1)
Reticulocyte Hemoglobin: 37.4 pg (ref >27.9)

## 2022-02-01 LAB — LACTATE DEHYDROGENASE: LDH: 225 U/L — ABNORMAL HIGH (ref 98–192)

## 2022-02-01 LAB — BILIRUBIN, DIRECT: Bilirubin, Direct: 0.3 mg/dL — ABNORMAL HIGH (ref 0.0–0.2)

## 2022-02-01 LAB — FOLATE: Folate: 47.6 ng/mL (ref >5.9)

## 2022-02-01 NOTE — Progress Notes (Signed)
?[facility] ?Telephone:([phone]   [phone] ? ?INITIAL CONSULT NOTE ? ?Patient Care Team: ?Penelope Galashow, Andrew O, MD as PCP - General (Internal Medicine) ? ?Hematological/Oncological History ?# Concern for Hemolytic Anemia ?01/15/2022: Vitamin b12 271, haptoglobin <8, GGT 7, Bilirubin 6.3, direct bili 0.6, Hgb 9.4, MCV 109.1, Cr 0.68,  ?02/01/2022: establish care with Dr. Leonides Schanzorsey  ? ?CHIEF COMPLAINTS/PURPOSE OF CONSULTATION:  ?"Concern for Hemolytic Anemia " ? ?HISTORY OF PRESENTING ILLNESS:  ?Stephanie Wu 61 y.o. female with medical history significant for HLD who presents for evaluation of a possible hemolytic anemia.  ? ?On review of the previous records Stephanie Wu underwent blood work on 01/15/2022 which showed a vitamin B12 level of 271, haptoglobin of less than 8, GGT 7, bilirubin 6.3, hemoglobin 9.4, MCV of 109.4.  Creatinine at the time was 0.68.  Due to concern for possible hemolytic anemia the patient was referred to hematology for further evaluation and management. ? ?On exam today Stephanie Wu she is here today because her "red blood cells are not doing what they are supposed to do".  She notes that she has never been diagnosed with a blood condition before.  She notes that she had no blood issues in childhood.  She notes that she did have issues with a bleed when she was in the hospital in June for 4 days underwent 2 blood transfusions.  She reports that she is not currently having any episodes of bleeding or bruising.  She went through menopause at age 61 approximately 5 years ago.  She notes that she has taken iron pills before in the past over-the-counter.  She notes recently her urine has been dark in the mornings and lighter color later in the day.  She reports it tends to be "tea colored".  She notes that her energy levels are quite good and that she had a had a boost in energy with her prior transfusions.  She does that she used to take vitamin B12 pills but quit this  approximately 3 to 4 months ago.  She notes that she simply ran out of the supply.  ? ?On further discussion she notes her family history is remarkable for anemia in her mother and aunt.  She reports that she is a never smoker, never drinker and does not take drugs.  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  She otherwise denies any fevers, chills, sweats, nausea, or diarrhea.  Full 10 point ROS is listed below. ? ?MEDICAL HISTORY:  ?Past Medical History:  ?Diagnosis Date  ? Allergic rhinitis, cause unspecified   ? Chicken pox age 61  ? Pure hypercholesterolemia   ? ? ?SURGICAL HISTORY: ?Past Surgical History:  ?Procedure Laterality Date  ? COLONOSCOPY  04/2012  ? Dr. Juanda ChanceBrodie.  Normal  ? TUBAL LIGATION    ? ? ?SOCIAL HISTORY: ?Social History  ? ?Socioeconomic History  ? Marital status: Widowed  ?  Spouse name: Not on file  ? Number of children: 3  ? Years of education: Not on file  ? Highest education level: Not on file  ?Occupational History  ? Not on file  ?Tobacco Use  ? Smoking status: Never  ? Smokeless tobacco: Never  ?Substance and Sexual Activity  ? Alcohol use: No  ? Drug use: No  ? Sexual activity: Not Currently  ?Other Topics Concern  ? Not on file  ?Social History Narrative  ? Lives with youngest son, middle [place] lives in [place], oldest son lives in [place].  Widowed 07/2011.  From [place]. Works from home, Clinical biochemist.  ? ?Social Determinants of Health  ? ?Financial Resource Strain: Not on file  ?Food Insecurity: Not on file  ?Transportation Needs: Not on file  ?Physical Activity: Not on file  ?Stress: Not on file  ?Social Connections: Not on file  ?Intimate Partner Violence: Not on file  ? ? ?FAMILY HISTORY: ?Family History  ?Problem Relation Age of Onset  ? Diabetes Mother   ? Hyperlipidemia Mother   ? Cancer Father   ?     lymphoma  ? Heart disease Neg Hx   ? Breast cancer Neg Hx   ? ? ?ALLERGIES:  has No Known Allergies. ? ?MEDICATIONS:  ?Current Outpatient Medications  ?Medication Sig Dispense  Refill  ? cholecalciferol (VITAMIN D) 1000 UNITS tablet Take 1,000 Units by mouth daily.    ? Multiple Vitamins-Minerals (MULTIVITAMIN WITH MINERALS) tablet Take 1 tablet by mouth daily.    ? ?Current Facility-Administered Medications  ?Medication Dose Route Frequency Provider Last Rate Last Admin  ? 0.9 %  sodium chloride infusion  500 mL Intravenous Continuous Hart Carwin, MD      ? ? ?REVIEW OF SYSTEMS:   ?Constitutional: ( - ) fevers, ( - )  chills , ( - ) night sweats ?Eyes: ( - ) blurriness of vision, ( - ) double vision, ( - ) watery eyes ?Ears, nose, mouth, throat, and face: ( - ) mucositis, ( - ) sore throat ?Respiratory: ( - ) cough, ( - ) dyspnea, ( - ) wheezes ?Cardiovascular: ( - ) palpitation, ( - ) chest discomfort, ( - ) lower extremity swelling ?Gastrointestinal:  ( - ) nausea, ( - ) heartburn, ( - ) change in bowel habits ?Skin: ( - ) abnormal skin rashes ?Lymphatics: ( - ) new lymphadenopathy, ( - ) easy bruising ?Neurological: ( - ) numbness, ( - ) tingling, ( - ) new weaknesses ?Behavioral/Psych: ( - ) mood change, ( - ) new changes  ?All other systems were reviewed with the patient and are negative. ? ?PHYSICAL EXAMINATION: ? ?Vitals:  ? 02/01/22 0918  ?BP: 137/89  ?Pulse: 66  ?Resp: 16  ?Temp: 97.8 ?F (36.6 ?C)  ?SpO2: 100%  ? ?Filed Weights  ? 02/01/22 0918  ?Weight: 134 lb 12.8 oz (61.1 kg)  ? ? ?GENERAL: well appearing middle-aged African-American female in NAD  ?SKIN: skin color, texture, turgor are normal, no rashes or significant lesions ?EYES: conjunctiva are pink and non-injected, sclera clear ?LUNGS: clear to auscultation and percussion with normal breathing effort ?HEART: regular rate & rhythm and no murmurs and no lower extremity edema ?Musculoskeletal: no cyanosis of digits and no clubbing  ?PSYCH: alert & oriented x 3, fluent speech ?NEURO: no focal motor/sensory deficits ? ?LABORATORY DATA:  ?I have reviewed the data as listed ? ?  Latest Ref Rng & Units 02/01/2022  ? 10:09 AM  11/22/2017  ?  9:37 AM 01/31/2017  ?  8:30 AM  ?CBC  ?WBC 4.0 - 10.5 K/uL 5.7   6.3   7.0    ?Hemoglobin 12.0 - 15.0 g/dL 16.1   09.6   04.5    ?Hematocrit 36.0 - 46.0 % 33.1   39.1   38.2    ?Platelets 150 - 400 K/uL 328   343   311    ? ? ? ?  Latest Ref Rng & Units 02/01/2022  ? 10:09 AM 11/22/2017  ?  9:37 AM 04/21/2017  ?  7:40 AM  ?CMP  ?Glucose 70 - 99 mg/dL 95   409   811    ?BUN 6 - 20 mg/dL 11   13   12     ?Creatinine 0.44 - 1.00 mg/dL   9.14   7.82    ?Sodium 135 - 145 mmol/L 144   142   140    ?Potassium 3.5 - 5.1 mmol/L 4.1   4.1   3.8    ?Chloride 98 - 111 mmol/L 109   101   101    ?CO2 22 - 32 mmol/L 29   26   28     ?Calcium 8.9 - 10.3 mg/dL 9.3   9.4   9.8    ?Total Protein 6.5 - 8.1 g/dL 6.8   7.5     ?Total Bilirubin 0.3 - 1.2 mg/dL 2.8   0.9     ?Alkaline Phos 38 - 126 U/L 121   87     ?AST 15 - 41 U/L 67   21     ?ALT 0 - 44 U/L 249   13     ? ? ? ?ASSESSMENT & PLAN ?Stephanie Wu 61 y.o. female with medical history significant for HLD who presents for evaluation of a possible hemolytic anemia.  ? ?After review of the labs, review of the records, and discussion with the patient the patients findings are most consistent with anemia of unclear etiology.  At this time does not entirely clear that this represents a hemolytic anemia.  We will do a full work-up in order to evaluate this further.  We will do full hemolytic labs to include haptoglobin, LDH, and direct bilirubin.  We will also do nutritional studies to include vitamin B12, MMA, iron and ferritin.  We will plan to have the patient return to clinic pending results of the above studies. ? ?# Concern for Hemolytic Anemia ?-- We will repeat CBC and CMP today ?--Hemolytic labs with haptoglobin, fractionated bilirubin, DAT, and LDH.  Also ordered reticulocytes ?--We will order nutritional labs with vitamin B12, folate, iron, and ferritin ?--Return to clinic pending the results of the above studies. ? ?Orders Placed This Encounter  ?Procedures  ?  CBC with Differential (Cancer Center Only)  ?  Standing Status:   Future  ?  Number of Occurrences:   1  ?  Standing Expiration Date:   02/02/2023  ? CMP (Cancer Center only)  ?  Standing Status:   Future  ?

## 2022-02-02 LAB — HAPTOGLOBIN: Haptoglobin: 34 mg/dL (ref 33–346)

## 2022-02-05 LAB — METHYLMALONIC ACID, SERUM: Methylmalonic Acid, Quantitative: 30656 nmol/L — ABNORMAL HIGH (ref 0–378)

## 2022-02-09 ENCOUNTER — Other Ambulatory Visit: Payer: Self-pay | Admitting: Hematology and Oncology

## 2022-02-09 DIAGNOSIS — E538 Deficiency of other specified B group vitamins: Secondary | ICD-10-CM

## 2022-02-12 ENCOUNTER — Telehealth: Payer: Self-pay | Admitting: *Deleted

## 2022-02-12 NOTE — Telephone Encounter (Addendum)
Contacted Ms. Stephanie Wu with information regarding results as in Dr. Derek Mound note below. ?She verbalized understanding of all information. Schedule message sent to schedule appts as requested by MD. ? ?----- Message ----- ?From: Jaci Standard, MD ? ?Please let Stephanie Wu know that her labs are consistent with a severe Vitamin b12 deficiency. She will need vitamin b12 injections (orders placed) weekly x 4 weeks, then monthly. She will need a lab check in 4 weeks with a return to clinic in 3 months time.  Please schedule these for her following the call. ? ? ? ? ? ?

## 2022-02-15 ENCOUNTER — Telehealth: Payer: Self-pay | Admitting: Hematology and Oncology

## 2022-02-15 NOTE — Telephone Encounter (Signed)
.  Called patient to schedule appointment per 4/7 inbasket, patient is aware of date and time.   ?

## 2022-02-16 DIAGNOSIS — Z1211 Encounter for screening for malignant neoplasm of colon: Secondary | ICD-10-CM | POA: Diagnosis not present

## 2022-02-19 ENCOUNTER — Inpatient Hospital Stay: Payer: 59 | Attending: Hematology and Oncology

## 2022-02-19 ENCOUNTER — Other Ambulatory Visit: Payer: Self-pay

## 2022-02-19 DIAGNOSIS — E538 Deficiency of other specified B group vitamins: Secondary | ICD-10-CM | POA: Insufficient documentation

## 2022-02-19 DIAGNOSIS — D599 Acquired hemolytic anemia, unspecified: Secondary | ICD-10-CM | POA: Diagnosis not present

## 2022-02-19 MED ORDER — CYANOCOBALAMIN 1000 MCG/ML IJ SOLN
1000.0000 ug | Freq: Once | INTRAMUSCULAR | Status: AC
  Administered 2022-02-19: 1000 ug via INTRAMUSCULAR
  Filled 2022-02-19: qty 1

## 2022-02-26 ENCOUNTER — Inpatient Hospital Stay: Payer: 59

## 2022-02-26 ENCOUNTER — Other Ambulatory Visit: Payer: Self-pay

## 2022-02-26 DIAGNOSIS — E538 Deficiency of other specified B group vitamins: Secondary | ICD-10-CM

## 2022-02-26 DIAGNOSIS — D599 Acquired hemolytic anemia, unspecified: Secondary | ICD-10-CM | POA: Diagnosis not present

## 2022-02-26 MED ORDER — CYANOCOBALAMIN 1000 MCG/ML IJ SOLN
1000.0000 ug | Freq: Once | INTRAMUSCULAR | Status: AC
  Administered 2022-02-26: 1000 ug via INTRAMUSCULAR
  Filled 2022-02-26: qty 1

## 2022-02-26 NOTE — Patient Instructions (Signed)
Vitamin B12 Injection ?What is this medication? ?Vitamin B12 (VAHY tuh min B12) prevents and treats low vitamin B12 levels in your body. It is used in people who do not get enough vitamin B12 from their diet or when their digestive tract does not absorb enough. Vitamin B12 plays an important role in maintaining the health of your nervous system and red blood cells. ?This medicine may be used for other purposes; ask your health care provider or pharmacist if you have questions. ?COMMON BRAND NAME(S): B-12 Compliance Kit, B-12 Injection Kit, Cyomin, Dodex, LA-12, Nutri-Twelve, Physicians EZ Use B-12, Primabalt ?What should I tell my care team before I take this medication? ?They need to know if you have any of these conditions: ?Kidney disease ?Leber's disease ?Megaloblastic anemia ?An unusual or allergic reaction to cyanocobalamin, cobalt, other medications, foods, dyes, or preservatives ?Pregnant or trying to get pregnant ?Breast-feeding ?How should I use this medication? ?This medication is injected into a muscle or deeply under the skin. It is usually given in a clinic or care team's office. However, your care team may teach you how to inject yourself. Follow all instructions. ?Talk to your care team about the use of this medication in children. Special care may be needed. ?Overdosage: If you think you have taken too much of this medicine contact a poison control center or emergency room at once. ?NOTE: This medicine is only for you. Do not share this medicine with others. ?What if I miss a dose? ?If you are given your dose at a clinic or care team's office, call to reschedule your appointment. If you give your own injections, and you miss a dose, take it as soon as you can. If it is almost time for your next dose, take only that dose. Do not take double or extra doses. ?What may interact with this medication? ?Alcohol ?Colchicine ?This list may not describe all possible interactions. Give your health care  provider a list of all the medicines, herbs, non-prescription drugs, or dietary supplements you use. Also tell them if you smoke, drink alcohol, or use illegal drugs. Some items may interact with your medicine. ?What should I watch for while using this medication? ?Visit your care team regularly. You may need blood work done while you are taking this medication. ?You may need to follow a special diet. Talk to your care team. Limit your alcohol intake and avoid smoking to get the best benefit. ?What side effects may I notice from receiving this medication? ?Side effects that you should report to your care team as soon as possible: ?Allergic reactions--skin rash, itching, hives, swelling of the face, lips, tongue, or throat ?Swelling of the ankles, hands, or feet ?Trouble breathing ?Side effects that usually do not require medical attention (report to your care team if they continue or are bothersome): ?Diarrhea ?This list may not describe all possible side effects. Call your doctor for medical advice about side effects. You may report side effects to FDA at 1-800-FDA-1088. ?Where should I keep my medication? ?Keep out of the reach of children. ?Store at room temperature between 15 and 30 degrees C (59 and 85 degrees F). Protect from light. Throw away any unused medication after the expiration date. ?NOTE: This sheet is a summary. It may not cover all possible information. If you have questions about this medicine, talk to your doctor, pharmacist, or health care provider. ?? 2023 Elsevier/[employer] Standard (2021-07-07 00:00:00) ? ?

## 2022-03-05 ENCOUNTER — Inpatient Hospital Stay: Payer: 59

## 2022-03-05 ENCOUNTER — Other Ambulatory Visit: Payer: Self-pay

## 2022-03-05 DIAGNOSIS — E538 Deficiency of other specified B group vitamins: Secondary | ICD-10-CM | POA: Diagnosis not present

## 2022-03-05 DIAGNOSIS — D599 Acquired hemolytic anemia, unspecified: Secondary | ICD-10-CM | POA: Diagnosis not present

## 2022-03-05 MED ORDER — CYANOCOBALAMIN 1000 MCG/ML IJ SOLN
1000.0000 ug | Freq: Once | INTRAMUSCULAR | Status: AC
  Administered 2022-03-05: 1000 ug via INTRAMUSCULAR
  Filled 2022-03-05: qty 1

## 2022-03-11 ENCOUNTER — Other Ambulatory Visit: Payer: Self-pay | Admitting: Hematology and Oncology

## 2022-03-11 DIAGNOSIS — E538 Deficiency of other specified B group vitamins: Secondary | ICD-10-CM

## 2022-03-12 ENCOUNTER — Other Ambulatory Visit: Payer: Self-pay

## 2022-03-12 ENCOUNTER — Inpatient Hospital Stay: Payer: 59 | Attending: Hematology and Oncology

## 2022-03-12 ENCOUNTER — Inpatient Hospital Stay: Payer: 59

## 2022-03-12 DIAGNOSIS — E538 Deficiency of other specified B group vitamins: Secondary | ICD-10-CM | POA: Insufficient documentation

## 2022-03-12 LAB — CMP (CANCER CENTER ONLY)
ALT: 12 U/L (ref 0–44)
AST: 17 U/L (ref 15–41)
Albumin: 4.5 g/dL (ref 3.5–5.0)
Alkaline Phosphatase: 87 U/L (ref 38–126)
Anion gap: 6 (ref 5–15)
BUN: 10 mg/dL (ref 6–20)
CO2: 30 mmol/L (ref 22–32)
Calcium: 9.7 mg/dL (ref 8.9–10.3)
Chloride: 108 mmol/L (ref 98–111)
Creatinine: 0.66 mg/dL (ref 0.44–1.00)
GFR, Estimated: >60 mL/min (ref >60)
Glucose, Bld: 90 mg/dL (ref 70–99)
Potassium: 4.3 mmol/L (ref 3.5–5.1)
Sodium: 144 mmol/L (ref 135–145)
Total Bilirubin: 1.9 mg/dL — ABNORMAL HIGH (ref 0.3–1.2)
Total Protein: 7.4 g/dL (ref 6.5–8.1)

## 2022-03-12 LAB — CBC WITH DIFFERENTIAL (CANCER CENTER ONLY)
Abs Immature Granulocytes: 0.01 10*3/uL (ref 0.00–0.07)
Basophils Absolute: 0 10*3/uL (ref 0.0–0.1)
Basophils Relative: 1 %
Eosinophils Absolute: 0.1 10*3/uL (ref 0.0–0.5)
Eosinophils Relative: 1 %
HCT: 39.4 % (ref 36.0–46.0)
Hemoglobin: 13.5 g/dL (ref 12.0–15.0)
Immature Granulocytes: 0 %
Lymphocytes Relative: 25 %
Lymphs Abs: 1.7 10*3/uL (ref 0.7–4.0)
MCH: 34.4 pg — ABNORMAL HIGH (ref 26.0–34.0)
MCHC: 34.3 g/dL (ref 30.0–36.0)
MCV: 100.3 fL — ABNORMAL HIGH (ref 80.0–100.0)
Monocytes Absolute: 0.3 10*3/uL (ref 0.1–1.0)
Monocytes Relative: 5 %
Neutro Abs: 4.6 10*3/uL (ref 1.7–7.7)
Neutrophils Relative %: 68 %
Platelet Count: 350 10*3/uL (ref 150–400)
RBC: 3.93 MIL/uL (ref 3.87–5.11)
RDW: 13.6 % (ref 11.5–15.5)
WBC Count: 6.7 10*3/uL (ref 4.0–10.5)
nRBC: 0 % (ref 0.0–0.2)

## 2022-03-12 MED ORDER — CYANOCOBALAMIN 1000 MCG/ML IJ SOLN
1000.0000 ug | Freq: Once | INTRAMUSCULAR | Status: AC
  Administered 2022-03-12: 1000 ug via INTRAMUSCULAR
  Filled 2022-03-12: qty 1

## 2022-05-06 ENCOUNTER — Encounter: Payer: Self-pay | Admitting: Internal Medicine

## 2022-05-06 ENCOUNTER — Encounter: Payer: Self-pay | Admitting: Gastroenterology

## 2022-05-13 ENCOUNTER — Other Ambulatory Visit: Payer: Self-pay | Admitting: Surgery

## 2022-05-13 DIAGNOSIS — D599 Acquired hemolytic anemia, unspecified: Secondary | ICD-10-CM

## 2022-05-13 DIAGNOSIS — E538 Deficiency of other specified B group vitamins: Secondary | ICD-10-CM

## 2022-05-14 ENCOUNTER — Inpatient Hospital Stay: Payer: 59 | Attending: Hematology and Oncology | Admitting: Hematology and Oncology

## 2022-05-14 ENCOUNTER — Other Ambulatory Visit: Payer: Self-pay

## 2022-05-14 ENCOUNTER — Inpatient Hospital Stay: Payer: 59

## 2022-05-14 VITALS — BP 147/92 | HR 74 | Temp 98.1°F | Resp 16 | Wt 147.0 lb

## 2022-05-14 DIAGNOSIS — E538 Deficiency of other specified B group vitamins: Secondary | ICD-10-CM

## 2022-05-14 DIAGNOSIS — D599 Acquired hemolytic anemia, unspecified: Secondary | ICD-10-CM

## 2022-05-14 LAB — CBC WITH DIFFERENTIAL (CANCER CENTER ONLY)
Abs Immature Granulocytes: 0 10*3/uL (ref 0.00–0.07)
Basophils Absolute: 0 10*3/uL (ref 0.0–0.1)
Basophils Relative: 0 %
Eosinophils Absolute: 0.1 10*3/uL (ref 0.0–0.5)
Eosinophils Relative: 1 %
HCT: 42.1 % (ref 36.0–46.0)
Hemoglobin: 13.9 g/dL (ref 12.0–15.0)
Immature Granulocytes: 0 %
Lymphocytes Relative: 26 %
Lymphs Abs: 1.8 10*3/uL (ref 0.7–4.0)
MCH: 28.9 pg (ref 26.0–34.0)
MCHC: 33 g/dL (ref 30.0–36.0)
MCV: 87.5 fL (ref 80.0–100.0)
Monocytes Absolute: 0.4 10*3/uL (ref 0.1–1.0)
Monocytes Relative: 5 %
Neutro Abs: 4.8 10*3/uL (ref 1.7–7.7)
Neutrophils Relative %: 68 %
Platelet Count: 326 10*3/uL (ref 150–400)
RBC: 4.81 MIL/uL (ref 3.87–5.11)
RDW: 13.1 % (ref 11.5–15.5)
WBC Count: 7.1 10*3/uL (ref 4.0–10.5)
nRBC: 0 % (ref 0.0–0.2)

## 2022-05-14 LAB — CMP (CANCER CENTER ONLY)
ALT: 14 U/L (ref 0–44)
AST: 18 U/L (ref 15–41)
Albumin: 4.5 g/dL (ref 3.5–5.0)
Alkaline Phosphatase: 86 U/L (ref 38–126)
Anion gap: 6 (ref 5–15)
BUN: 11 mg/dL (ref 8–23)
CO2: 29 mmol/L (ref 22–32)
Calcium: 9.6 mg/dL (ref 8.9–10.3)
Chloride: 108 mmol/L (ref 98–111)
Creatinine: 0.71 mg/dL (ref 0.44–1.00)
GFR, Estimated: >60 mL/min (ref >60)
Glucose, Bld: 91 mg/dL (ref 70–99)
Potassium: 4.4 mmol/L (ref 3.5–5.1)
Sodium: 143 mmol/L (ref 135–145)
Total Bilirubin: 2 mg/dL — ABNORMAL HIGH (ref 0.3–1.2)
Total Protein: 7.5 g/dL (ref 6.5–8.1)

## 2022-05-14 LAB — RETIC PANEL
Immature Retic Fract: 11 % (ref 2.3–15.9)
RBC.: 4.79 MIL/uL (ref 3.87–5.11)
Retic Count, Absolute: 49.3 10*3/uL (ref 19.0–186.0)
Retic Ct Pct: 1 % (ref 0.4–3.1)
Reticulocyte Hemoglobin: 31.5 pg (ref >27.9)

## 2022-05-14 LAB — VITAMIN B12: Vitamin B-12: 1013 pg/mL — ABNORMAL HIGH (ref 180–914)

## 2022-05-14 NOTE — Progress Notes (Signed)
[facility] [phone]   [phone]  PROGRESS NOTE  Patient Care Team: Penelope Galas, MD as PCP - General (Internal Medicine)  Hematological/Oncological History # Severe Vitamin B12 deficiency  01/15/2022: Vitamin b12 271, haptoglobin <8, GGT 7, Bilirubin 6.3, direct bili 0.6, Hgb 9.4, MCV 109.1, Cr 0.68,  02/01/2022: establish care with Dr. Leonides Schanz  02/19/2022: Vitamin b12 shots weekly started. Continued x 4  05/21/2022: restart monthly vitamin b12 shots.   Interval History:  Stephanie Wu 61 y.o. female with medical history significant for severe vitamin B12 deficiency who presents for a follow up visit. The patient's last visit was on 02/01/2022 at which time she established care. In the interim since the last visit her hemoglobin levels and MCV have normalized.  On exam today Ms. Khaimov reports that she has been tolerating the vitamin B12 shots quite well.  She notes that she has been more active and is not "sitting around as much or nodding off".  She notes that her energy levels are quite good and she feels like she is able to walk without losing her balance.  She has she is also feels more comfortable driving.  Overall she feels great improvement in her health.  She denies any fevers, chills, sweats, nausea, ming or diarrhea.  Of note the patient's last shot was on 03/12/2022.  She is interested in getting the shots restarted.  MEDICAL HISTORY:  Past Medical History:  Diagnosis Date   Allergic rhinitis, cause unspecified    Chicken pox age 39   Pure hypercholesterolemia     SURGICAL HISTORY: Past Surgical History:  Procedure Laterality Date   COLONOSCOPY  04/2012   Dr. Juanda Chance.  Normal   TUBAL LIGATION      SOCIAL HISTORY: Social History   Socioeconomic History   Marital status: Widowed    Spouse name: Not on file   Number of children: 3   Years of education: Not on file   Highest education level: Not on file  Occupational History    Not on file  Tobacco Use   Smoking status: Never   Smokeless tobacco: Never  Substance and Sexual Activity   Alcohol use: No   Drug use: No   Sexual activity: Not Currently  Other Topics Concern   Not on file  Social History Narrative   Lives with youngest son, middle son lives in [place], oldest son lives in [place].  Widowed 07/2011.  From [place]. Works from home, Clinical biochemist.   Social Determinants of Health   Financial Resource Strain: Not on file  Food Insecurity: Not on file  Transportation Needs: Not on file  Physical Activity: Not on file  Stress: Not on file  Social Connections: Not on file  Intimate Partner Violence: Not on file    FAMILY HISTORY: Family History  Problem Relation Age of Onset   Diabetes Mother    Hyperlipidemia Mother    Cancer Father        lymphoma   Heart disease Neg Hx    Breast cancer Neg Hx     ALLERGIES:  has No Known Allergies.  MEDICATIONS:  Current Outpatient Medications  Medication Sig Dispense Refill   cholecalciferol (VITAMIN D) 1000 UNITS tablet Take 1,000 Units by mouth daily.     Multiple Vitamins-Minerals (MULTIVITAMIN WITH MINERALS) tablet Take 1 tablet by mouth daily.     Current Facility-Administered Medications  Medication Dose Route Frequency Provider Last Rate Last Admin   0.9 %  sodium chloride  infusion  500 mL Intravenous Continuous Lafayette Dragon, MD        REVIEW OF SYSTEMS:   Constitutional: ( - ) fevers, ( - )  chills , ( - ) night sweats Eyes: ( - ) blurriness of vision, ( - ) double vision, ( - ) watery eyes Ears, nose, mouth, throat, and face: ( - ) mucositis, ( - ) sore throat Respiratory: ( - ) cough, ( - ) dyspnea, ( - ) wheezes Cardiovascular: ( - ) palpitation, ( - ) chest discomfort, ( - ) lower extremity swelling Gastrointestinal:  ( - ) nausea, ( - ) heartburn, ( - ) change in bowel habits Skin: ( - ) abnormal skin rashes Lymphatics: ( - ) new lymphadenopathy, ( - ) easy bruising Neurological: ( - )  numbness, ( - ) tingling, ( - ) new weaknesses Behavioral/Psych: ( - ) mood change, ( - ) new changes  All other systems were reviewed with the patient and are negative.  PHYSICAL EXAMINATION: ECOG PERFORMANCE STATUS: 0 - Asymptomatic  Vitals:   05/14/22 1030  BP: (!) 147/92  Pulse: 74  Resp: 16  Temp: 98.1 F (36.7 C)  SpO2: 98%   Filed Weights   05/14/22 1030  Weight: 147 lb (66.7 kg)    GENERAL: Well-appearing middle-aged African-American female alert, no distress and comfortable SKIN: skin color, texture, turgor are normal, no rashes or significant lesions EYES: conjunctiva are pink and non-injected, sclera clear LUNGS: clear to auscultation and percussion with normal breathing effort HEART: regular rate & rhythm and no murmurs and no lower extremity edema Musculoskeletal: no cyanosis of digits and no clubbing  PSYCH: alert & oriented x 3, fluent speech NEURO: no focal motor/sensory deficits  LABORATORY DATA:  I have reviewed the data as listed    Latest Ref Rng & Units 05/14/2022    9:46 AM 03/12/2022    9:16 AM 02/01/2022   10:09 AM  CBC  WBC 4.0 - 10.5 K/uL 7.1  6.7  5.7   Hemoglobin 12.0 - 15.0 g/dL 13.9  13.5  11.4   Hematocrit 36.0 - 46.0 % 42.1  39.4  33.1   Platelets 150 - 400 K/uL 326  350  328        Latest Ref Rng & Units 05/14/2022    9:46 AM 03/12/2022    9:16 AM 02/01/2022   10:09 AM  CMP  Glucose 70 - 99 mg/dL 91  90  95   BUN 8 - 23 mg/dL 11  10  11    Creatinine 0.44 - 1.00 mg/dL 0.71  0.66  0.66   Sodium 135 - 145 mmol/L 143  144  144   Potassium 3.5 - 5.1 mmol/L 4.4  4.3  4.1   Chloride 98 - 111 mmol/L 108  108  109   CO2 22 - 32 mmol/L 29  30  29    Calcium 8.9 - 10.3 mg/dL 9.6  9.7  9.3   Total Protein 6.5 - 8.1 g/dL 7.5  7.4  6.8   Total Bilirubin 0.3 - 1.2 mg/dL 2.0  1.9  2.8   Alkaline Phos 38 - 126 U/L 86  87  121   AST 15 - 41 U/L 18  17  67   ALT 0 - 44 U/L 14  12  249     RADIOGRAPHIC STUDIES: No results found.  ASSESSMENT &  PLAN Stephanie Wu 61 y.o. female with medical history significant for severe vitamin B12 deficiency  who presents for a follow up visit.   # Severe Vitamin B12 Deficiency -- Patient's findings were consistent with a marked vitamin B12 deficiency.  Methylmalonic acid was elevated around 30,000 prior to initiation of treatment --Patient had hemoglobin of 11.4 with MCV of 114.9.  After 4 doses of IM vitamin B12 hemoglobin 13.9 with MCV of 87.5 Plan: -- Labs today show white blood cell count 7.1, hemoglobin 13.9, MCV 87.5, and platelets of 326 --Continue monthly vitamin B12 injections --Once vitamin B12 stores and MMA have normalized can consider transition to p.o. vitamin B12 therapy only. --Return to clinic in 6 months time to reevaluate.  No orders of the defined types were placed in this encounter.   All questions were answered. The patient knows to call the clinic with any problems, questions or concerns.  A total of more than 30 minutes were spent on this encounter with face-to-face time and non-face-to-face time, including preparing to see the patient, ordering tests and/or medications, counseling the patient and coordination of care as outlined above.   Ulysees Barns, MD Department of Hematology/Oncology [facility] Phone: [phone] Pager: [phone] Email: [email or web address]  05/23/2022 5:31 PM

## 2022-05-16 LAB — METHYLMALONIC ACID, SERUM: Methylmalonic Acid, Quantitative: 604 nmol/L — ABNORMAL HIGH (ref 0–378)

## 2022-05-17 ENCOUNTER — Telehealth: Payer: Self-pay | Admitting: Hematology and Oncology

## 2022-05-17 NOTE — Telephone Encounter (Signed)
Scheduled per 7/7 los, pt has been called and confirmed

## 2022-05-21 ENCOUNTER — Inpatient Hospital Stay: Payer: 59

## 2022-05-21 ENCOUNTER — Ambulatory Visit: Payer: 59

## 2022-05-21 ENCOUNTER — Other Ambulatory Visit: Payer: Self-pay

## 2022-05-21 DIAGNOSIS — E538 Deficiency of other specified B group vitamins: Secondary | ICD-10-CM

## 2022-05-21 MED ORDER — CYANOCOBALAMIN 1000 MCG/ML IJ SOLN
1000.0000 ug | Freq: Once | INTRAMUSCULAR | Status: AC
  Administered 2022-05-21: 1000 ug via INTRAMUSCULAR
  Filled 2022-05-21: qty 1

## 2022-05-23 ENCOUNTER — Encounter: Payer: Self-pay | Admitting: Hematology and Oncology

## 2022-06-18 ENCOUNTER — Other Ambulatory Visit: Payer: Self-pay

## 2022-06-18 ENCOUNTER — Inpatient Hospital Stay: Payer: 59 | Attending: Hematology and Oncology

## 2022-06-18 DIAGNOSIS — E538 Deficiency of other specified B group vitamins: Secondary | ICD-10-CM | POA: Insufficient documentation

## 2022-06-18 MED ORDER — CYANOCOBALAMIN 1000 MCG/ML IJ SOLN
1000.0000 ug | Freq: Once | INTRAMUSCULAR | Status: AC
  Administered 2022-06-18: 1000 ug via INTRAMUSCULAR
  Filled 2022-06-18: qty 1

## 2022-07-09 ENCOUNTER — Ambulatory Visit: Payer: 59

## 2022-08-06 ENCOUNTER — Ambulatory Visit: Payer: 59

## 2022-09-03 ENCOUNTER — Inpatient Hospital Stay: Payer: 59 | Attending: Hematology and Oncology

## 2022-09-03 DIAGNOSIS — E538 Deficiency of other specified B group vitamins: Secondary | ICD-10-CM | POA: Insufficient documentation

## 2022-09-03 MED ORDER — CYANOCOBALAMIN 1000 MCG/ML IJ SOLN
1000.0000 ug | Freq: Once | INTRAMUSCULAR | Status: AC
  Administered 2022-09-03: 1000 ug via INTRAMUSCULAR
  Filled 2022-09-03: qty 1

## 2022-10-08 ENCOUNTER — Ambulatory Visit: Payer: 59

## 2022-11-05 ENCOUNTER — Inpatient Hospital Stay: Payer: 59

## 2022-11-05 ENCOUNTER — Inpatient Hospital Stay: Payer: 59 | Admitting: Hematology and Oncology

## 2023-01-27 DIAGNOSIS — I10 Essential (primary) hypertension: Secondary | ICD-10-CM | POA: Diagnosis not present

## 2023-10-17 DIAGNOSIS — Z5986 Financial insecurity: Secondary | ICD-10-CM | POA: Diagnosis not present

## 2023-10-17 DIAGNOSIS — D599 Acquired hemolytic anemia, unspecified: Secondary | ICD-10-CM | POA: Diagnosis not present

## 2023-10-17 DIAGNOSIS — E785 Hyperlipidemia, unspecified: Secondary | ICD-10-CM | POA: Diagnosis not present

## 2023-10-17 DIAGNOSIS — I10 Essential (primary) hypertension: Secondary | ICD-10-CM | POA: Diagnosis not present

## 2023-10-20 DIAGNOSIS — Z0001 Encounter for general adult medical examination with abnormal findings: Secondary | ICD-10-CM | POA: Diagnosis not present

## 2023-10-20 DIAGNOSIS — Z0101 Encounter for examination of eyes and vision with abnormal findings: Secondary | ICD-10-CM | POA: Diagnosis not present

## 2023-10-20 DIAGNOSIS — R5383 Other fatigue: Secondary | ICD-10-CM | POA: Diagnosis not present

## 2023-10-20 DIAGNOSIS — E559 Vitamin D deficiency, unspecified: Secondary | ICD-10-CM | POA: Diagnosis not present

## 2023-10-20 DIAGNOSIS — R739 Hyperglycemia, unspecified: Secondary | ICD-10-CM | POA: Diagnosis not present

## 2023-10-20 DIAGNOSIS — Z1231 Encounter for screening mammogram for malignant neoplasm of breast: Secondary | ICD-10-CM | POA: Diagnosis not present

## 2023-10-20 DIAGNOSIS — I10 Essential (primary) hypertension: Secondary | ICD-10-CM | POA: Diagnosis not present

## 2023-10-20 DIAGNOSIS — E782 Mixed hyperlipidemia: Secondary | ICD-10-CM | POA: Diagnosis not present

## 2024-01-19 DIAGNOSIS — Z124 Encounter for screening for malignant neoplasm of cervix: Secondary | ICD-10-CM | POA: Diagnosis not present

## 2024-01-20 ENCOUNTER — Other Ambulatory Visit: Payer: Self-pay | Admitting: Family Medicine

## 2024-01-20 DIAGNOSIS — E2839 Other primary ovarian failure: Secondary | ICD-10-CM

## 2024-01-20 DIAGNOSIS — Z1231 Encounter for screening mammogram for malignant neoplasm of breast: Secondary | ICD-10-CM

## 2024-02-02 DIAGNOSIS — E538 Deficiency of other specified B group vitamins: Secondary | ICD-10-CM | POA: Diagnosis not present

## 2024-02-10 ENCOUNTER — Ambulatory Visit
Admission: RE | Admit: 2024-02-10 | Discharge: 2024-02-10 | Disposition: A | Discharge status: Home / Self Care | Source: Ambulatory Visit | Attending: Family Medicine | Admitting: Family Medicine

## 2024-02-10 DIAGNOSIS — Z1231 Encounter for screening mammogram for malignant neoplasm of breast: Secondary | ICD-10-CM | POA: Diagnosis not present

## 2024-03-15 DIAGNOSIS — R7303 Prediabetes: Secondary | ICD-10-CM | POA: Diagnosis not present

## 2024-03-15 DIAGNOSIS — E559 Vitamin D deficiency, unspecified: Secondary | ICD-10-CM | POA: Diagnosis not present

## 2024-03-15 DIAGNOSIS — Z79899 Other long term (current) drug therapy: Secondary | ICD-10-CM | POA: Diagnosis not present

## 2024-03-15 DIAGNOSIS — E538 Deficiency of other specified B group vitamins: Secondary | ICD-10-CM | POA: Diagnosis not present

## 2024-03-15 DIAGNOSIS — E785 Hyperlipidemia, unspecified: Secondary | ICD-10-CM | POA: Diagnosis not present

## 2024-10-01 ENCOUNTER — Other Ambulatory Visit
